# Patient Record
Sex: Female | Born: 1945
Health system: Southern US, Community
[De-identification: ages and names within clinical notes are randomized; demographics above are authoritative.]

## PROBLEM LIST (undated history)

## (undated) DIAGNOSIS — R002 Palpitations: Secondary | ICD-10-CM

## (undated) DIAGNOSIS — E785 Hyperlipidemia, unspecified: Secondary | ICD-10-CM

## (undated) DIAGNOSIS — E039 Hypothyroidism, unspecified: Secondary | ICD-10-CM

## (undated) DIAGNOSIS — M1990 Unspecified osteoarthritis, unspecified site: Secondary | ICD-10-CM

## (undated) DIAGNOSIS — R011 Cardiac murmur, unspecified: Secondary | ICD-10-CM

## (undated) DIAGNOSIS — I10 Essential (primary) hypertension: Secondary | ICD-10-CM

## (undated) DIAGNOSIS — F419 Anxiety disorder, unspecified: Secondary | ICD-10-CM

## (undated) DIAGNOSIS — T7840XA Allergy, unspecified, initial encounter: Secondary | ICD-10-CM

## (undated) DIAGNOSIS — Z8739 Personal history of other diseases of the musculoskeletal system and connective tissue: Secondary | ICD-10-CM

## (undated) DIAGNOSIS — H269 Unspecified cataract: Secondary | ICD-10-CM

## (undated) HISTORY — DX: Cardiac murmur, unspecified: R01.1

## (undated) HISTORY — DX: Allergy, unspecified, initial encounter: T78.40XA

## (undated) HISTORY — DX: Hyperlipidemia, unspecified: E78.5

## (undated) HISTORY — DX: Unspecified osteoarthritis, unspecified site: M19.90

## (undated) HISTORY — DX: Anxiety disorder, unspecified: F41.9

## (undated) HISTORY — PX: ANTERIOR FUSION CERVICAL SPINE: SUR626

## (undated) HISTORY — DX: Palpitations: R00.2

## (undated) HISTORY — PX: COLONOSCOPY: SHX174

## (undated) HISTORY — PX: BREAST EXCISIONAL BIOPSY: SUR124

## (undated) HISTORY — PX: ABDOMINAL HYSTERECTOMY: SHX81

## (undated) HISTORY — DX: Hypothyroidism, unspecified: E03.9

## (undated) HISTORY — PX: APPENDECTOMY: SHX54

## (undated) HISTORY — DX: Essential (primary) hypertension: I10

## (undated) HISTORY — PX: TONSILLECTOMY AND ADENOIDECTOMY: SHX28

## (undated) HISTORY — DX: Personal history of other diseases of the musculoskeletal system and connective tissue: Z87.39

## (undated) HISTORY — DX: Unspecified cataract: H26.9

## (undated) HISTORY — PX: CHOLECYSTECTOMY: SHX55

---

## 1998-11-23 ENCOUNTER — Ambulatory Visit (HOSPITAL_COMMUNITY): Admission: RE | Admit: 1998-11-23 | Discharge: 1998-11-23 | Payer: Self-pay | Admitting: *Deleted

## 1999-05-16 ENCOUNTER — Encounter: Payer: Self-pay | Admitting: *Deleted

## 1999-05-16 ENCOUNTER — Ambulatory Visit (HOSPITAL_COMMUNITY): Admission: RE | Admit: 1999-05-16 | Discharge: 1999-05-16 | Payer: Self-pay | Admitting: *Deleted

## 1999-11-26 ENCOUNTER — Encounter: Payer: Self-pay | Admitting: Endocrinology

## 1999-11-26 ENCOUNTER — Ambulatory Visit (HOSPITAL_COMMUNITY): Admission: RE | Admit: 1999-11-26 | Discharge: 1999-11-26 | Payer: Self-pay | Admitting: Endocrinology

## 2000-09-24 ENCOUNTER — Encounter: Admission: RE | Admit: 2000-09-24 | Discharge: 2000-09-24 | Payer: Self-pay | Admitting: *Deleted

## 2000-09-24 ENCOUNTER — Encounter: Payer: Self-pay | Admitting: *Deleted

## 2000-10-09 ENCOUNTER — Encounter (INDEPENDENT_AMBULATORY_CARE_PROVIDER_SITE_OTHER): Payer: Self-pay | Admitting: Specialist

## 2000-10-09 ENCOUNTER — Ambulatory Visit (HOSPITAL_BASED_OUTPATIENT_CLINIC_OR_DEPARTMENT_OTHER): Admission: RE | Admit: 2000-10-09 | Discharge: 2000-10-09 | Payer: Self-pay | Admitting: *Deleted

## 2001-09-28 ENCOUNTER — Encounter: Payer: Self-pay | Admitting: *Deleted

## 2001-09-28 ENCOUNTER — Encounter: Admission: RE | Admit: 2001-09-28 | Discharge: 2001-09-28 | Payer: Self-pay | Admitting: *Deleted

## 2001-10-04 ENCOUNTER — Encounter: Admission: RE | Admit: 2001-10-04 | Discharge: 2001-10-04 | Payer: Self-pay | Admitting: Orthopedic Surgery

## 2001-10-04 ENCOUNTER — Encounter: Payer: Self-pay | Admitting: Orthopedic Surgery

## 2002-03-07 ENCOUNTER — Ambulatory Visit (HOSPITAL_COMMUNITY): Admission: RE | Admit: 2002-03-07 | Discharge: 2002-03-07 | Payer: Self-pay | Admitting: Cardiovascular Disease

## 2002-03-07 ENCOUNTER — Encounter: Payer: Self-pay | Admitting: Cardiovascular Disease

## 2002-08-29 ENCOUNTER — Encounter: Payer: Self-pay | Admitting: Emergency Medicine

## 2002-08-29 ENCOUNTER — Emergency Department (HOSPITAL_COMMUNITY): Admission: EM | Admit: 2002-08-29 | Discharge: 2002-08-29 | Payer: Self-pay | Admitting: Emergency Medicine

## 2002-09-15 ENCOUNTER — Encounter: Admission: RE | Admit: 2002-09-15 | Discharge: 2002-09-15 | Payer: Self-pay | Admitting: *Deleted

## 2002-09-15 ENCOUNTER — Encounter: Payer: Self-pay | Admitting: *Deleted

## 2003-01-06 ENCOUNTER — Encounter: Admission: RE | Admit: 2003-01-06 | Discharge: 2003-01-06 | Payer: Self-pay | Admitting: *Deleted

## 2003-01-06 ENCOUNTER — Encounter: Payer: Self-pay | Admitting: *Deleted

## 2003-01-16 ENCOUNTER — Encounter: Payer: Self-pay | Admitting: *Deleted

## 2003-01-18 ENCOUNTER — Ambulatory Visit (HOSPITAL_COMMUNITY): Admission: RE | Admit: 2003-01-18 | Discharge: 2003-01-19 | Payer: Self-pay | Admitting: *Deleted

## 2003-01-18 ENCOUNTER — Encounter: Payer: Self-pay | Admitting: *Deleted

## 2003-01-18 ENCOUNTER — Encounter (INDEPENDENT_AMBULATORY_CARE_PROVIDER_SITE_OTHER): Payer: Self-pay | Admitting: Specialist

## 2003-04-10 ENCOUNTER — Encounter: Admission: RE | Admit: 2003-04-10 | Discharge: 2003-04-10 | Payer: Self-pay | Admitting: Endocrinology

## 2003-04-10 ENCOUNTER — Encounter: Payer: Self-pay | Admitting: Endocrinology

## 2003-09-05 ENCOUNTER — Encounter: Admission: RE | Admit: 2003-09-05 | Discharge: 2003-09-05 | Payer: Self-pay | Admitting: Surgery

## 2004-04-25 ENCOUNTER — Inpatient Hospital Stay (HOSPITAL_COMMUNITY): Admission: RE | Admit: 2004-04-25 | Discharge: 2004-04-26 | Payer: Self-pay | Admitting: Neurosurgery

## 2004-09-30 ENCOUNTER — Encounter: Admission: RE | Admit: 2004-09-30 | Discharge: 2004-09-30 | Payer: Self-pay | Admitting: Surgery

## 2005-04-02 ENCOUNTER — Encounter: Admission: RE | Admit: 2005-04-02 | Discharge: 2005-04-02 | Payer: Self-pay | Admitting: Surgery

## 2005-08-01 ENCOUNTER — Encounter: Admission: RE | Admit: 2005-08-01 | Discharge: 2005-08-01 | Payer: Self-pay | Admitting: Surgery

## 2005-10-06 ENCOUNTER — Encounter: Admission: RE | Admit: 2005-10-06 | Discharge: 2005-10-06 | Payer: Self-pay | Admitting: Surgery

## 2005-12-26 ENCOUNTER — Encounter: Admission: RE | Admit: 2005-12-26 | Discharge: 2005-12-26 | Payer: Self-pay | Admitting: Endocrinology

## 2005-12-31 ENCOUNTER — Encounter: Payer: Self-pay | Admitting: *Deleted

## 2006-01-12 ENCOUNTER — Encounter: Admission: RE | Admit: 2006-01-12 | Discharge: 2006-01-12 | Payer: Self-pay | Admitting: Endocrinology

## 2006-10-13 ENCOUNTER — Encounter: Admission: RE | Admit: 2006-10-13 | Discharge: 2006-10-13 | Payer: Self-pay | Admitting: Surgery

## 2006-10-27 ENCOUNTER — Encounter: Admission: RE | Admit: 2006-10-27 | Discharge: 2006-10-27 | Payer: Self-pay | Admitting: Surgery

## 2007-03-01 ENCOUNTER — Ambulatory Visit (HOSPITAL_COMMUNITY): Admission: RE | Admit: 2007-03-01 | Discharge: 2007-03-01 | Payer: Self-pay | Admitting: Endocrinology

## 2007-03-11 ENCOUNTER — Encounter: Admission: RE | Admit: 2007-03-11 | Discharge: 2007-03-11 | Payer: Self-pay | Admitting: Endocrinology

## 2007-10-22 ENCOUNTER — Encounter: Admission: RE | Admit: 2007-10-22 | Discharge: 2007-10-22 | Payer: Self-pay | Admitting: Surgery

## 2008-05-05 ENCOUNTER — Encounter: Admission: RE | Admit: 2008-05-05 | Discharge: 2008-05-05 | Payer: Self-pay | Admitting: Endocrinology

## 2008-06-30 ENCOUNTER — Observation Stay (HOSPITAL_COMMUNITY): Admission: EM | Admit: 2008-06-30 | Discharge: 2008-07-01 | Payer: Self-pay | Admitting: Emergency Medicine

## 2008-11-01 ENCOUNTER — Encounter: Admission: RE | Admit: 2008-11-01 | Discharge: 2008-11-01 | Payer: Self-pay | Admitting: Surgery

## 2009-11-13 ENCOUNTER — Encounter: Admission: RE | Admit: 2009-11-13 | Discharge: 2009-11-13 | Payer: Self-pay | Admitting: Surgery

## 2009-12-31 ENCOUNTER — Telehealth: Payer: Self-pay | Admitting: Internal Medicine

## 2010-01-11 ENCOUNTER — Encounter: Payer: Self-pay | Admitting: *Deleted

## 2010-01-24 ENCOUNTER — Encounter: Payer: Self-pay | Admitting: *Deleted

## 2010-01-29 ENCOUNTER — Ambulatory Visit: Payer: Self-pay | Admitting: Internal Medicine

## 2010-01-29 ENCOUNTER — Encounter (INDEPENDENT_AMBULATORY_CARE_PROVIDER_SITE_OTHER): Payer: Self-pay | Admitting: *Deleted

## 2010-09-22 ENCOUNTER — Encounter: Payer: Self-pay | Admitting: Surgery

## 2010-10-03 NOTE — Miscellaneous (Signed)
Summary: [facility]  Clinical Lists Changes  Medications: Added new medication of MIRALAX   POWD (POLYETHYLENE GLYCOL 3350) As per prep  instructions. - Signed Added new medication of REGLAN 10 MG  TABS (METOCLOPRAMIDE HCL) As per prep instructions. - Signed Added new medication of DULCOLAX 5 MG  TBEC (BISACODYL) Day before procedure take 2 at 3pm and 2 at 8pm. - Signed Rx of MIRALAX   POWD (POLYETHYLENE GLYCOL 3350) As per prep  instructions.;  #255gm x 0;  Signed;  Entered by: Ezra Sites RN;  Authorized by: Hart Carwin MD;  Method used: Electronically to [facility] [phone]*, [address] [place], [place], [place]  [address], Ph: [phone] or [phone], Fax: [phone] Rx of REGLAN 10 MG  TABS (METOCLOPRAMIDE HCL) As per prep instructions.;  #2 x 0;  Signed;  Entered by: Ezra Sites RN;  Authorized by: Hart Carwin MD;  Method used: Electronically to [facility] [phone]*, [address], [place], [place], [place]  [address], Ph: [phone] or [phone], Fax: [phone] Rx of DULCOLAX 5 MG  TBEC (BISACODYL) Day before procedure take 2 at 3pm and 2 at 8pm.;  #4 x 0;  Signed;  Entered by: Ezra Sites RN;  Authorized by: Hart Carwin MD;  Method used: Electronically to [facility] [phone]*, [address] [place], [place], [place]  [address], Ph: [phone] or [phone], Fax: [phone] Allergies: Added new allergy or adverse reaction of PENICILLIN Added new allergy or adverse reaction of SULFA Added new allergy or adverse reaction of STADOL Added new allergy or adverse reaction of DEMEROL Observations: Added new observation of NKA: F (01/29/2010 9:45)    Prescriptions: DULCOLAX 5 MG  TBEC (BISACODYL) Day before procedure take 2 at 3pm and 2 at 8pm.  #4 x 0   Entered by:   Ezra Sites RN   Authorized by:   Hart Carwin MD   Signed by:   Ezra Sites RN on 01/29/2010   Method used:   Electronically to        [facility] [phone]*  (retail)       [address] [place], [place]  [address]       Ph: [phone] or [phone]       Fax: [phone]   RxID:   [id number] REGLAN 10 MG  TABS (METOCLOPRAMIDE HCL) As per prep instructions.  #2 x 0   Entered by:   Ezra Sites RN   Authorized by:   Hart Carwin MD   Signed by:   Ezra Sites RN on 01/29/2010   Method used:   Electronically to        [facility] [phone]* (retail)       [address] [place], [place]  [address]       Ph: [phone] or [phone]       Fax: [phone]   RxID:   [id number] MIRALAX   POWD (POLYETHYLENE GLYCOL 3350) As per prep  instructions.  #255gm x 0   Entered by:   Ezra Sites RN   Authorized by:   Hart Carwin MD   Signed by:   Ezra Sites RN on 01/29/2010   Method used:   Electronically to        [facility] [phone]* (retail)       [address] [place], [place]  [address]       Ph: [phone] or [phone]       Fax: [phone]   RxID:   [id number]

## 2010-10-03 NOTE — Letter (Signed)
Summary: [facility] Instructions  [facility]  [address] [place], [place] [address]   Phone: [phone]  Fax: [phone]       Misty Newman    11-30-1945    MRN: [id number]       Procedure Day /Date:  Thursday 02/07/2010     Arrival Time:  9:00 am     Procedure Time: 10:00 am     Location of Procedure:                    _x _  [facility] (4th Floor)   PREPARATION FOR COLONOSCOPY WITH MIRALAX  Starting 5 days prior to your procedure Saturday 6/4 do not eat nuts, seeds, popcorn, corn, beans, peas,  salads, or any raw vegetables.  Do not take any fiber supplements (e.g. Metamucil, Citrucel, and Benefiber). ____________________________________________________________________________________________________   THE DAY BEFORE YOUR PROCEDURE         DATE: Wednesday 6/8  1   Drink clear liquids the entire day-NO SOLID FOOD  2   Do not drink anything colored red or purple.  Avoid juices with pulp.  No orange juice.  3   Drink at least 64 oz. (8 glasses) of fluid/clear liquids during the day to prevent dehydration and help the prep work efficiently.  CLEAR LIQUIDS INCLUDE: Water Jello Ice Popsicles Tea (sugar ok, no milk/cream) Powdered fruit flavored drinks Coffee (sugar ok, no milk/cream) Gatorade Juice: apple, white grape, white cranberry  Lemonade Clear bullion, consomm, broth Carbonated beverages (any kind) Strained chicken noodle soup Hard Candy  4   Mix the entire bottle of Miralax with 64 oz. of Gatorade/Powerade in the morning and put in the refrigerator to chill.  5   At 3:00 pm take 2 Dulcolax/Bisacodyl tablets.  6   At 4:30 pm take one Reglan/Metoclopramide tablet.  7  Starting at 5:00 pm drink one 8 oz glass of the Miralax mixture every 15-20 minutes until you have finished drinking the entire 64 oz.  You should finish drinking prep around 7:30 or 8:00 pm.  8   If you are nauseated, you may take the 2nd Reglan/Metoclopramide  tablet at 6:30 pm.        9    At 8:00 pm take 2 more DULCOLAX/Bisacodyl tablets.     THE DAY OF YOUR PROCEDURE      DATE:  Thursday 6/9  You may drink clear liquids until 8:00 am   (2 HOURS BEFORE PROCEDURE).   MEDICATION INSTRUCTIONS  Unless otherwise instructed, you should take regular prescription medications with a small sip of water as early as possible the morning of your procedure.   Additional medication instructions: Do not take Triamterne/HCTZ         OTHER INSTRUCTIONS  You will need a responsible adult at least 63 years of age to accompany you and drive you home.   This person must remain in the waiting room during your procedure.  Wear loose fitting clothing that is easily removed.  Leave jewelry and other valuables at home.  However, you may wish to bring a book to read or an iPod/MP3 player to listen to music as you wait for your procedure to start.  Remove all body piercing jewelry and leave at home.  Total time from sign-in until discharge is approximately 2-3 hours.  You should go home directly after your procedure and rest.  You can resume normal activities the day after your procedure.  The day of your  procedure you should not:   Drive   Make legal decisions   Operate machinery   Drink alcohol   Return to work  You will receive specific instructions about eating, activities and medications before you leave.   The above instructions have been reviewed and explained to me by   Ezra Sites RN  Jan 29, 2010 10:22 AM     I fully understand and can verbalize these instructions _____________________________ Date _______

## 2010-11-07 ENCOUNTER — Encounter (INDEPENDENT_AMBULATORY_CARE_PROVIDER_SITE_OTHER): Payer: Self-pay | Admitting: *Deleted

## 2010-11-07 ENCOUNTER — Other Ambulatory Visit: Payer: Self-pay | Admitting: Surgery

## 2010-11-07 DIAGNOSIS — Z1231 Encounter for screening mammogram for malignant neoplasm of breast: Secondary | ICD-10-CM

## 2010-11-19 ENCOUNTER — Ambulatory Visit
Admission: RE | Admit: 2010-11-19 | Discharge: 2010-11-19 | Disposition: A | Discharge status: Home / Self Care | Payer: PRIVATE HEALTH INSURANCE | Source: Ambulatory Visit | Attending: Surgery | Admitting: Surgery

## 2010-11-19 DIAGNOSIS — Z1231 Encounter for screening mammogram for malignant neoplasm of breast: Secondary | ICD-10-CM

## 2010-11-27 ENCOUNTER — Ambulatory Visit (AMBULATORY_SURGERY_CENTER): Payer: PRIVATE HEALTH INSURANCE | Admitting: *Deleted

## 2010-11-27 VITALS — Ht 67.0 in | Wt 158.0 lb

## 2010-11-27 DIAGNOSIS — Z800 Family history of malignant neoplasm of digestive organs: Secondary | ICD-10-CM

## 2010-11-27 MED ORDER — PEG-KCL-NACL-NASULF-NA ASC-C 100 G PO SOLR: 1.0000 | Freq: Once | ORAL | Status: AC

## 2010-12-09 ENCOUNTER — Telehealth: Payer: Self-pay | Admitting: Internal Medicine

## 2010-12-09 NOTE — Telephone Encounter (Signed)
OK to stay on ASA and Voltaren

## 2010-12-11 ENCOUNTER — Encounter: Payer: Self-pay | Admitting: Internal Medicine

## 2010-12-11 ENCOUNTER — Ambulatory Visit (AMBULATORY_SURGERY_CENTER): Payer: PRIVATE HEALTH INSURANCE | Admitting: Internal Medicine

## 2010-12-11 DIAGNOSIS — Z800 Family history of malignant neoplasm of digestive organs: Secondary | ICD-10-CM

## 2010-12-11 DIAGNOSIS — D126 Benign neoplasm of colon, unspecified: Secondary | ICD-10-CM

## 2010-12-11 DIAGNOSIS — R933 Abnormal findings on diagnostic imaging of other parts of digestive tract: Secondary | ICD-10-CM

## 2010-12-11 DIAGNOSIS — Z1211 Encounter for screening for malignant neoplasm of colon: Secondary | ICD-10-CM

## 2010-12-11 MED ORDER — SODIUM CHLORIDE 0.9 % IV SOLN: 500.0000 mL | INTRAVENOUS | Status: DC

## 2010-12-11 NOTE — Patient Instructions (Addendum)
INFORMATION ON POLYPS , HIGH FIBER DIET , 5 YR RECALL DATE AND DIET RECOMMENDED FOR TODAY REVIEWED WITH PT AND CAREPARTNER.

## 2010-12-12 ENCOUNTER — Telehealth: Payer: Self-pay

## 2010-12-12 NOTE — Telephone Encounter (Signed)

## 2010-12-13 ENCOUNTER — Encounter: Payer: Self-pay | Admitting: Internal Medicine

## 2010-12-13 NOTE — Telephone Encounter (Signed)
Error

## 2010-12-16 ENCOUNTER — Encounter: Payer: Self-pay | Admitting: Internal Medicine

## 2011-01-14 NOTE — H&P (Signed)
Misty Newman, Misty Newman                 ACCOUNT NO.:  [id number]   MEDICAL RECORD NO.:  [id number]          PATIENT TYPE:  EMS   LOCATION:  ED                           FACILITY:  [facility]   PHYSICIAN:  Eduard Clos, MDDATE OF BIRTH:  28-Mar-1946   DATE OF ADMISSION:  06/30/2008  DATE OF DISCHARGE:                              HISTORY & PHYSICAL   PRIMARY CARE PHYSICIAN:  Brooke Bonito, M.D.   CARDIOLOGIST:  Richard A. Alanda Amass, M.D.   CHIEF COMPLAINT:  Palpitations.   HISTORY OF PRESENT ILLNESS:  A 65 year old female with a history of  hypertension, hyperlipidemia, hypothyroidism, who presented to the ER  complaining of palpitations off and on over the last 24 hours.  Patient  has been having these symptoms wherein she will get a sudden onset of  her heart beating fast.  Denies any associated dizziness, loss of  consciousness, nausea or vomiting, fever or chills.  Denies any chest  pain.  The palpitations resolve spontaneously.  She has noticed that her  mother has had a new roommate in the nursing home, in which case is a  problem for her mother.  This has prompted her to get very anxious.  She  states that the symptoms started then.  She did go to her cardiologist,  Dr. Alanda Amass, yesterday, wherein she was found to have high blood  pressure and her Altace was increased to 10 mg from 5.  Patient also  stated that she has not been taking her Lopressor as advised, she was  only taking half a pill.  She was supposed to take a whole pill of 100  mg twice daily.  Patient in the ER was found to have a heart rate of 110  and is being admitted for further observation and management along with  control of blood pressure.   PAST MEDICAL HISTORY:  1. Hypertension.  2. Hyperlipidemia.  3. Hyperthyroidism.  4. Chronic back pain.   PAST SURGICAL HISTORY:  1. Hysterectomy.  2. Appendectomy.  3. Neck surgery for disk prolapse.   MEDICATIONS PRIOR TO ADMISSION:  1. Metoprolol 100 mg p.o.  b.i.d.  2. Lanoxin 125 mcg p.o. daily.  3. Synthroid 75 mcg p.o. daily.  4. Altace 10 mg p.o. daily.  5. Zocor 20 mg p.o. daily.  6. Triamterene/HCTZ 37.5/25 mg p.o. daily p.r.n.  7. Flexeril 10 mg p.o. daily p.r.n. low back pain.  8. Voltaren 75 mg p.o. daily p.r.n. low back pain.   ALLERGIES:  PENICILLIN, SULFA.   FAMILY HISTORY:  Nothing contributory.   SOCIAL HISTORY:  Patient lives with her husband.  Denies smoking  cigarettes, drinking alcohol, or using illegal drugs.   REVIEW OF SYSTEMS:  As per history of present illness, nothing else  significant.   PHYSICAL EXAMINATION:  Patient currently no acute distress.  VITAL SIGNS:  Blood pressure 180/115, pulse 110 per minute, temperature  98, respirations 18 per minute, O2 sat 98%.  HEENT:  Anicteric.  No pallor.  CHEST:  Bilateral air entry present.  No rhonchi.  No crepitation.  HEART:  S1 and S2  heard.  ABDOMEN:  Soft, nontender.  Bowel sounds heard.  No guarding, no  rigidity.  CNS:  Awake and alert to time, place, and person.  Moves upper and lower  extremities 5/5.  EXTREMITIES:  Peripheral pulses are felt bilaterally.   LABS:  EKG shows a normal sinus rhythm .  Rate of around 90 per minute.   Chest x-ray:  Nothing acute.   CBC:  WBC is 14.4, hemoglobin 14.4, hematocrit 42.9, platelets 295.  PT/INR 12.6 and 0.9.  Complete metabolic panel:  Sodium 139, potassium  4.1, chloride 106, carbon dioxide 23, glucose 155, BUN 16, creatinine  0.8, total bilirubin 0.7, AST 32, ALT 33, albumin 4, calcium 9.6,  magnesium 2.1, CK-MB 1.2.  Troponin I less than 0.05.  BNP less than 30.  Digoxin 0.3.  Urine negative for nitrite or leukocytes.   ASSESSMENT:  1. Palpitations, rule out any arrhythmia.  2. Uncontrolled hypertension.  3. History of hyperlipidemia.  4. Possibility of anxiety disorder.  5. Chronic back pain.   PLAN:  To admit patient to telemetry for observation to see if there are  any arrhythmias. Control her blood  pressure.  Resume home medication.  Will place on p.r.n. Xanax.  Further recommendations as condition  warrants.      Eduard Clos, MD  Electronically Signed     ANK/MEDQ  D:  06/30/2008  T:  06/30/2008  Job:  [id number]

## 2011-01-14 NOTE — Discharge Summary (Signed)
NAMEANYE, BROSE                 ACCOUNT NO.:  [id number]   MEDICAL RECORD NO.:  [id number]          PATIENT TYPE:  INP   LOCATION:  1421                         FACILITY:  [facility]   PHYSICIAN:  Isidor Holts, M.D.  DATE OF BIRTH:  1946/08/21   DATE OF ADMISSION:  06/30/2008  DATE OF DISCHARGE:  07/01/2008                               DISCHARGE SUMMARY   PRIMARY CARE PHYSICIAN:  Brooke Bonito, M.D.   PRIMARY CARDIOLOGIST:  Richard A. Alanda Amass, M.D.   DISCHARGE DIAGNOSES:  1. Palpitations.  2. Uncontrolled hypertension.  3. Dyslipidemia.  4. Dysthyroidism.  5. History of chronic back pain.   DISCHARGE MEDICATIONS:  1. Metoprolol 100 mg p.o. b.i.d.  2. Lanoxin 125 mcg p.o. daily.  3. Synthroid 100 mcg p.o. daily (was on 75 mcg p.o. daily).  4. Altace 10 mg p.o. b.i.d. (was on 10 mg p.o. daily).  5. Zocor 20 mg p.o. q.h.s.  6. Lovaza 2 tabs p.o. daily.  7. Flexeril 10 mg p.o. daily p.r.n.  8. Voltaren 75 mg p.o. p.r.n. daily.   Note: Triamterene/Hydrochlorothiazide has ben discontinued, until  reviewed by primary MD.   PROCEDURES:  Chest x-ray dated June 30, 2008, this showed no acute  cardiopulmonary disease.   CONSULTATIONS:  None.   ADMISSION HISTORY:  As in H and P notes of June 30, 2008, dictated by  Dr. Eduard Clos.  However, in brief, this is a 65 year old  female, with known history of hypertension, dyslipidemia, dysthyroidism,  chronic back pain, status post hysterectomy, status post appendectomy,  status post neck surgery for disk prolapse, presenting with palpitations  on and off for 24 hours prior.  Denied associated chest pain, shortness  of breath, dizziness or alteration of consciousness.  Reportedly, she  had been seen by her cardiologist, Dr. Pearletha Furl. Alanda Amass, on June 25, 2008, and her was found to have uncontrolled hypertension.  Her  Altace was therefore increased from 5 mg p.o. daily to 10 mg p.o. daily,  although she had not  filled the prescription at the time of presentation  to the emergency department.  In the emergency department, she was found  to have a tachycardia, with heart rate of 110, blood pressure was found  to be markedly elevated at 180/115 mmHg.  The patient was therefore  admitted for further evaluation, investigation and management.   HOSPITAL COURSE:  1. Uncontrolled hypertension.  For details of presentation, refer to      admission history above.  The patient presented with a blood      pressure of 180/115 mmHg.  She was placed on pre-admission      Metoprolol at a dose of 100 mg p.o. b.i.d. and commenced on      Ramipril at a dose of 10 mg p.o. daily.  By July 01, 2008, blood      pressure had improved to 153/70 mmHg.  Ramipril dosage has been      increased further to 10 mg p.o. b.i.d. and it is anticipated that      this will be adequate for blood  pressure control, although further      adjustments may be made by the patient's primary MD or      cardiologist, as indicated.   1. Dysthyroidism.  The patient is a known case of hypothyroidism, on      replacement therapy.  TSH during this hospitalization was mildly      elevated at 6.225.  Synthroid dosage has therefore been increased      to 100 mcg daily.   1. Palpitations.  This is likely secondary to #1 and #2 above.  The      patient remained asymptomatic throughout her hospitalization and      remained in sinus rhythm.  Cardiac enzymes were cycled and remained      unelevated.  A 12-lead EKG showed no acute ischemic changes.      Clearly acute coronary syndrome has been ruled out.   1. Dyslipidemia.  The patient remained on pre-admission doses of      statin, as well as omega-3 fatty acids.   1. History of chronic back pain.  This was not problematic during the      course of this hospitalization.   DISPOSITION:  The patient was on July 01, 2008, considered clinically  stable for discharge.  She was asymptomatic, and  was therefore  discharged accordingly.   DIET:  Heart healthy.   ACTIVITY:  As tolerated.   FOLLOWUP INSTRUCTIONS:  1. The patient is to follow up with her primary cardiologist, Dr.      Pearletha Furl. Alanda Amass, in the next 1-2 weeks.  She has been      instructed to call for an appointment.  2. In addition, she has been instructed to follow up with her primary      MD, Dr. Brooke Bonito, per prior scheduled appointment.      Isidor Holts, M.D.  Electronically Signed     CO/MEDQ  D:  07/01/2008  T:  07/01/2008  Job:  [id number]   cc:   Brooke Bonito, M.D.  Fax: [phone]   Richard A. Alanda Amass, M.D.  Fax: [phone]

## 2011-01-17 NOTE — Op Note (Signed)
NAME:  Misty Newman, Misty Newman                           ACCOUNT NO.:  [id number]   MEDICAL RECORD NO.:  [id number]                   PATIENT TYPE:  OIB   LOCATION:  NA                                   FACILITY:  [facility]   PHYSICIAN:  Maisie Fus B. Samuella Cota, M.D.               DATE OF BIRTH:  1946/06/07   DATE OF PROCEDURE:  01/18/2003  DATE OF DISCHARGE:                                 OPERATIVE REPORT   CCS# [id number]   PREOPERATIVE DIAGNOSIS:  1. Chronic cholecystitis with cholelithiasis.  2. Capillary hemangioma, anterior abdominal wall.   POSTOPERATIVE DIAGNOSIS:  1. Chronic cholecystitis with cholelithiasis.  2. Capillary hemangioma, anterior abdominal wall.   OPERATION PERFORMED:  1. Laparoscopic cholecystectomy with operative cholangiogram.  2. Excision of capillary hemangioma, anterior abdominal wall.   SURGEON:  Maisie Fus B. Samuella Cota, M.D.   ASSISTANT:  Gita Kudo, M.D.   ANESTHESIA:  General.  Anesthesiologist and CRNA.   DESCRIPTION OF PROCEDURE:  The patient was taken to the operating room and  placed on the table in supine position and after satisfactory general  anesthetic with intubation, the entire abdomen was prepped and draped as a  sterile field.  0.25% Marcaine without epinephrine was injected at the  infraumbilical area and then a vertical infraumbilical incision was made  through skin and subcutaneous tissue and midline fascia.  The peritoneal  cavity was entered.  A finger was placed into the peritoneal cavity and  there were no adhesions going towards the gallbladder.  A pursestring suture  of 0 Vicryl was placed and the Hasson trocar placed into the abdomen and the  abdomen insufflated to 14 mmHg pressure.  A second 10 mm trocar was placed  just to the right of midline.  Two 5 mm trocars were placed laterally.  The  gallbladder was placed on traction.  There were adhesions to the gallbladder  which was taken down with gentle dissection.  The cystic duct and cystic  artery were identified.  The cystic duct was dissected free and an Endoclip  placed on the gallbladder side of the cystic duct.  A small opening was made  in the cystic duct.  A Cook cholangiocath was introduced through a separate  stab wound in the right upper quadrant of the abdomen.  The catheter was  placed into the duct and held with a Hemoclip.  Using real time C-arm  fluoroscopy, cholangiogram was carried out which revealed good filling of  the entire biliary system with spillage into the duodenum.  There were no  filling defects.  Following the cholangiogram which showed a fairly long  cystic duct, the cystic duct was triply clipped and then divided.  The  cystic artery was doubly clipped on the remaining side, once on the  gallbladder side and divided.  The gallbladder was dissected from the bed  and there were two further arteries which were doubly  clipped and divided.  The gallbladder was removed from the bed using the cautery.  Bleeding was  controlled.  The gallbladder was then brought to the anterior abdominal wall  at the infraumbilical incision and because of large stones, the gallbladder  was opened.  Some stones were crushed and removed and the gallbladder  delivered.  The pursestring suture at the infraumbilical incision was tied  and there seemed to be good closure at this point.  The right upper quadrant  was irrigated.  There seemed to be no evidence of bleeding or bile leak.  The two lateral trocars were removed under direct vision and abdomen  deflated.  The second 10 mm trocar was removed.  The patient had a small  capillary hemangioma of the anterior abdominal wall between the two midline  trocars.  This was excised with a #11 blade and the base was not very large,  so the base was just cauterized to control the bleeding.  All trocar sites  were then injected with 0.25% Marcaine without epinephrine.  The two midline  incisions were closed with running subcutaneous  sutures of 4-0 Monocryl and  the two lateral sites were closed with single simple inverted subcu 4-0  Monocryl.  Benzoin and half inch Steri-Strips were applied to all trocar  sites and also to the area of cholangiocath puncture site.  Dry sterile  dressings were applied.  The patient seemed to tolerate the procedure well  and was taken to the PACU in satisfactory condition.                                                Thomas B. Samuella Cota, M.D.    TBP/MEDQ  D:  01/18/2003  T:  01/18/2003  Job:  [id number]   cc:   Brooke Bonito, M.D.  [address] [place] [address]  [place]  [place] [address]  Fax: [phone]   Richard A. Alanda Amass, M.D.  [phone] [address]  [place]  [place] [address]  Fax: [phone]

## 2011-01-17 NOTE — Op Note (Signed)
NAME:  Misty Newman, Misty Newman                           ACCOUNT NO.:  [id number]   MEDICAL RECORD NO.:  [id number]                   PATIENT TYPE:  INP   LOCATION:  2899                                 FACILITY:  [facility]   PHYSICIAN:  Hewitt Shorts, M.D.            DATE OF BIRTH:  Apr 01, 1946   DATE OF PROCEDURE:  04/25/2004  DATE OF DISCHARGE:                                 OPERATIVE REPORT   PREOPERATIVE DIAGNOSIS:  Cervical disk herniation, cervical spondylosis,  cervical degenerative disk disease, cervical stenosis and cervical  radiculopathy.   POSTOPERATIVE DIAGNOSIS:  Cervical disk herniation, cervical spondylosis,  cervical degenerative disk disease, cervical stenosis and cervical  radiculopathy.   OPERATION PERFORMED:  C4-5, C5-6 and C6-7 anterior cervical diskectomy and  arthrodesis with iliac crest allograft and tether cervical plating.   SURGEON:  Hewitt Shorts, M.D.   ASSISTANT:  Clydene Fake, M.D.   ANESTHESIA:  General endotracheal.   INDICATIONS FOR PROCEDURE:  The patient is a 65 year old woman who presented  with acute left C5 cervical radiculopathy with profound weakness of the left  deltoid and moderate weakness of the left biceps.  She was found to have a  left C4-5 cervical disk herniation superimposed upon a significant  spondylosis and degenerative disk disease.  There was advanced spondylosis  and degenerative disk disease at both C5-6 and C6-7 with significant canal  stenosis.  Decision was made to proceed with three level anterior cervical  diskectomy and arthrodesis.   DESCRIPTION OF PROCEDURE:  The patient was brought to the operating room and  placed under general endotracheal anesthesia.  The patient was placed in 10  pounds of halter traction.  The neck was prepped with Betadine soap and  solution and draped in sterile fashion.  An oblique incision was made on the  left side of the neck paralleling the anterior border of the left  sternocleidomastoid.  The line of the incision was infiltrated with local  anesthetic and epinephrine.  Dissection was carried down through the  subcutaneous tissue and platysma with bipolar cautery used to maintain  hemostasis. Then dissection was carried out to an avascular plane, leaving  the sternocleidomastoid, carotid artery and jugular vein laterally and  trachea and esophagus medially.  The ventral aspect of the vertebral column  identified and a localizing x-ray was taken.  The C4-5, C5-6, and C6-7  intervertebral disk spaces were identified.  An x-ray was taken to confirm  the localization.  The operating microscope was draped and brought into the  field to provide additional magnification, illumination and visualization  and the decompression was performed using microdissection and microsurgical  technique. Anterior osteophytic overgrowth was removed using a double action  rongeur and osteophyte removal tool and the X-Max drill.  Diskectomy was  performed using a variety of microcurets and pituitary rongeurs and the  cartilaginous end plates of the corresponding vertebrae were removed using  microcurets along with the X-Max drill.  There was significant posterior  osteophytic overgrowth at each level.  This was removed using the X-Max  drill along with 2 mm Kerrison punch with a thin foot plate.  On there left  side at C4-5, we did find a soft disk herniation and this was removed and at  each level, we were able to decompress the spinal canal, thecal sac and  nerve roots within the neural foramina bilaterally.  Once the decompression  was completed, hemostasis was established with the use of Gelfoam soaked in  Thrombin .  We then used bone sizers to size the intervertebral disk space  at each level and selected two 6 mm grafts and one 7 mm graft.  They were  all hydrated in saline solution and positioned in the intervertebral disk  space and countersunk.  We selected 6 mm grafts  for the C4-5 and C5-6 level  and a 7 mm graft at C6-7.  Each graft was positioned and countersunk.  Once  all three grafts were positioned, the traction was discontinued.  We  selected a 52 mm tether cervical plate and it was positioned over the fusion  construct and secured to each of the vertebrae with 4.0 x 14 mm screws.  We  placed two screws each at the C4 and C7 levels and a single screw each at  the C5 and C6 levels.  X-ray was taken and we did adjust the screws at C4.  Another x-ray was taken.  We were pleased with the position of the bone  grafts and plate and screws.  The wound was irrigated extensively with  bacitracin solution, checked for hemostasis which was established and  confirmed and then we proceeded with closure.  The platysma was closed with  interrupted inverted 2-0 undyed Vicryl sutures.  Subcutaneous and  subcuticular layer were closed with interrupted inverted 2-0 and 3-0 undyed  Vicryl sutures and the skin edges were approximated with DermaBond.  The  patient tolerated the procedure well.  The estimated blood loss was 75 mL.  Sponge, needle and instrument counts were correct.  Following surgery, the  patient was to be reversed from his anesthetic, extubated and transferred to  the recovery room for further care.                                               Hewitt Shorts, M.D.    RWN/MEDQ  D:  04/25/2004  T:  04/26/2004  Job:  [id number]

## 2011-01-17 NOTE — Op Note (Signed)
[place]  Patient:    Misty Newman, Misty Newman                        MRN: [id number] Proc. Date: 10/09/00 Adm. Date:  [id number] Disc. Date: [id number] Attending:  Kandis Mannan CC:         Bernadene Person, M.D.   Operative Report  CCS NUMBER:  [id number]  PREOPERATIVE DIAGNOSIS:  Bloody nipple discharge, right breast.  POSTOPERATIVE DIAGNOSIS:  Bloody nipple discharge, right breast.  OPERATION:  Excision of ductal tissue, right breast.  SURGEON:  Maisie Fus B. Samuella Cota, M.D.  ANESTHESIA:  1% Xylocaine local with anesthesia monitoring.  ANESTHESIOLOGIST:  CRNA.  DESCRIPTION OF PROCEDURE:  The patient was taken to the operating room and placed on the table in a supine position.  The right breast was prepped and draped as a sterile field.  A circumareolar incision was outlined from about 7 oclock to 11 oclock.  The patient had had a history of bloody nipple discharge, but pressure over the 9 oclock position today did not produce any drainage.  Xylocaine 1% local was used to infiltrate the skin and underlying breast tissue.  A circumareolar incision was then made, as outlined.  Just beneath the skin, a large, dilated duct at the 9 oclock position was encountered.  The ductal tissue beneath the nipple and areolar complex was divided back to the nipple.  The enlarged duct did not show any evidence of blood, but there appeared to be some fleshy material inside the duct which was placed in the specimen container.  Dissection, mainly using the cautery, was used to go around this duct laterally.  It did not seem to go very far medially, and deep.  It was hard to know whether the dilatation I was seeing was probably cystic changes with some small cysts or whether this was actually a duct going that deep posteriorly.  I probably went 2 cm deep to the nipple and areolar complex.  The tissue was excised, and a suture was then placed at the open duct which was just  retronipple.  A hemostat had been placed into the duct, and it did not seem to pass very far, and it was thought that maybe the fleshy material which came out of the duct was the papilloma.  The other changes probably represent fibrocystic changes.  Bleeding was controlled with the cautery.  The wound was irrigated.  Subcutaneous tissue closed with interrupted sutures of 3-0 Vicryl, and the skin was closed with a running subcuticular suture of 4-0 Vicryl.  Benzoin and Steri-Strips were used to reinforce the circumareolar incision.  A pressure dressing using 4 x 4s, ABD, and 4 inch Hypafix was applied.  The patient seemed to tolerate the procedure well and was taken to the PACU in satisfactory condition. DD:  10/09/00 TD:  10/11/00 Job: [id number]

## 2011-02-24 NOTE — Letter (Signed)
Summary: Previsit letter  [facility]  [address] [place], [place] [address]   Phone: [phone]  Fax: [phone]       01/11/2010 MRN: [id number]  Misty Newman [address] [place], [place]  [address]  [place]  Dear Ms. [name],  Welcome to the Gastroenterology Division at [facility].    You are scheduled to see a nurse for your pre-procedure visit on 01/29/2010 at 10:00am on the 3rd floor at [facility], [address].  We ask that you try to arrive at our office 15 minutes prior to your appointment time to allow for check-in.  Your nurse visit will consist of discussing your medical and surgical history, your immediate family medical history, and your medications.    Please bring a complete list of all your medications or, if you prefer, bring the medication bottles and we will list them.  We will need to be aware of both prescribed and over the counter drugs.  We will need to know exact dosage information as well.  If you are on blood thinners (Coumadin, Plavix, Aggrenox, Ticlid, etc.) please call our office today/prior to your appointment, as we need to consult with your physician about holding your medication.   Please be prepared to read and sign documents such as consent forms, a financial agreement, and acknowledgement forms.  If necessary, and with your consent, a friend or relative is welcome to sit-in on the nurse visit with you.  Please bring your insurance card so that we may make a copy of it.  If your insurance requires a referral to see a specialist, please bring your referral form from your primary care physician.  No co-pay is required for this nurse visit.     If you cannot keep your appointment, please call [phone] to cancel or reschedule prior to your appointment date.  This allows [place] the opportunity to schedule an appointment for another patient in need of care.    Thank you for choosing [facility] for your medical needs.   We appreciate the opportunity to care for you.  Please visit [place] at our website  to learn more about our practice.                     Sincerely.                                                                                                                   The Gastroenterology Division

## 2011-02-24 NOTE — Letter (Signed)
Summary: Pre Visit Letter Revised  [facility]  [address] [place], [place] [address]   Phone: [phone]  Fax: [phone]        11/07/2010 MRN: [id number] Misty Newman [address] [place], [place]  [address]  [place]             Procedure Date:  12-11-10           Recall Colon---Dr. Juanda Chance   Welcome to the Gastroenterology Division at [facility].    You are scheduled to see a nurse for your pre-procedure visit on 11-27-10 at 9:00a.m. on the 3rd floor at [facility], [address].  We ask that you try to arrive at our office 15 minutes prior to your appointment time to allow for check-in.  Please take a minute to review the attached form.  If you answer "Yes" to one or more of the questions on the first page, we ask that you call the person listed at your earliest opportunity.  If you answer "No" to all of the questions, please complete the rest of the form and bring it to your appointment.    Your nurse visit will consist of discussing your medical and surgical history, your immediate family medical history, and your medications.   If you are unable to list all of your medications on the form, please bring the medication bottles to your appointment and we will list them.  We will need to be aware of both prescribed and over the counter drugs.  We will need to know exact dosage information as well.    Please be prepared to read and sign documents such as consent forms, a financial agreement, and acknowledgement forms.  If necessary, and with your consent, a friend or relative is welcome to sit-in on the nurse visit with you.  Please bring your insurance card so that we may make a copy of it.  If your insurance requires a referral to see a specialist, please bring your referral form from your primary care physician.  No co-pay is required for this nurse visit.     If you cannot keep your appointment, please call [phone] to cancel or reschedule prior to  your appointment date.  This allows [place] the opportunity to schedule an appointment for another patient in need of care.    Thank you for choosing [facility] for your medical needs.  We appreciate the opportunity to care for you.  Please visit [place] at our website  to learn more about our practice.  Sincerely, The Gastroenterology Division

## 2011-02-24 NOTE — Telephone Encounter (Signed)
Summary: Schedule colonoscopy  Phone Note Outgoing Call   Call placed by: Harlow Mares CMA [facility]),  Dec 31, 2009 11:48 AM Call placed to: Patient Summary of Call: Left message on patients machine to call back. patient is due for a colonoscopy due to positive family history of polyps.  Initial call taken by: Harlow Mares CMA [facility]),  Dec 31, 2009 11:49 AM  Follow-up for Phone Call        scheduled on 02/07/2010 Follow-up by: Harlow Mares CMA [facility]),  Jan 11, 2010 2:51 PM

## 2011-02-24 NOTE — Letter (Signed)
Summary: Miralax Instructions  [facility]  [address]   [place], [place] [address]   Phone: [phone]  Fax: [phone]       Misty Newman    October 14, 1945    MRN: [id number]       Procedure Day /Date: Thursday, 02-07-10     Arrival Time: 9:00 a.m.     Procedure Time: 10:00 a.m.     Location of Procedure:                    x   [facility] (4th Floor)   PREPARATION FOR COLONOSCOPY WITH MIRALAX  Starting 5 days prior to your procedure 02-02-10 do not eat nuts, seeds, popcorn, corn, beans, peas,  salads, or any raw vegetables.  Do not take any fiber supplements (e.g. Metamucil, Citrucel, and Benefiber). ____________________________________________________________________________________________________   THE DAY BEFORE YOUR PROCEDURE         DATE: 02-06-10  DAY: Wednesday  1   Drink clear liquids the entire day-NO SOLID FOOD  2   Do not drink anything colored red or purple.  Avoid juices with pulp.  No orange juice.  3   Drink at least 64 oz. (8 glasses) of fluid/clear liquids during the day to prevent dehydration and help the prep work efficiently.  CLEAR LIQUIDS INCLUDE: Water Jello Ice Popsicles Tea (sugar ok, no milk/cream) Powdered fruit flavored drinks Coffee (sugar ok, no milk/cream) Gatorade Juice: apple, white grape, white cranberry  Lemonade Clear bullion, consomm, broth Carbonated beverages (any kind) Strained chicken noodle soup Hard Candy  4   Mix the entire bottle of Miralax with 64 oz. of Gatorade/Powerade in the morning and put in the refrigerator to chill.  5   At 3:00 pm take 2 Dulcolax/Bisacodyl tablets.  6   At 4:30 pm take one Reglan/Metoclopramide tablet.  7  Starting at 5:00 pm drink one 8 oz glass of the Miralax mixture every 15-20 minutes until you have finished drinking the entire 64 oz.  You should finish drinking prep around 7:30 or 8:00 pm.  8   If you are nauseated, you may take the 2nd Reglan/Metoclopramide  tablet at 6:30 pm.        9    At 8:00 pm take 2 more DULCOLAX/Bisacodyl tablets.     THE DAY OF YOUR PROCEDURE      DATE:  02-07-10  DAY: Thursday  You may drink clear liquids until 8:00 a.m.  (2 HOURS BEFORE PROCEDURE).   MEDICATION INSTRUCTIONS  Unless otherwise instructed, you should take regular prescription medications with a small sip of water as early as possible the morning of your procedure.    Additional medication instructions:  Do not take Triamterene/HCTZ day of procedure.         OTHER INSTRUCTIONS  You will need a responsible adult at least 63 years of age to accompany you and drive you home.   This person must remain in the waiting room during your procedure.  Wear loose fitting clothing that is easily removed.  Leave jewelry and other valuables at home.  However, you may wish to bring a book to read or an iPod/MP3 player to listen to music as you wait for your procedure to start.  Remove all body piercing jewelry and leave at home.  Total time from sign-in until discharge is approximately 2-3 hours.  You should go home directly after your procedure and rest.  You can resume normal activities the day after your procedure.  The day of your procedure you should not:   Drive   Make legal decisions   Operate machinery   Drink alcohol   Return to work  You will receive specific instructions about eating, activities and medications before you leave.   The above instructions have been reviewed and explained to me by    Ezra Sites RN  Jan 29, 2010 4:01 PM     I fully understand and can verbalize these instructions _____________________________ Date _______

## 2011-06-02 LAB — CARDIAC PANEL(CRET KIN+CKTOT+MB+TROPI)
CK, MB: 1.6
CK, MB: 2.2
CK, MB: 2.4
Relative Index: INVALID
Relative Index: INVALID
Relative Index: INVALID
Total CK: 25
Total CK: 41
Total CK: 42
Troponin I: 0.01
Troponin I: 0.02
Troponin I: 0.02

## 2011-06-02 LAB — BASIC METABOLIC PANEL
BUN: 17
CO2: 25
Calcium: 9
Chloride: 103
Creatinine, Ser: 0.82
GFR calc Af Amer: >60
GFR calc non Af Amer: >60
Glucose, Bld: 103 — ABNORMAL HIGH
Potassium: 3.6
Sodium: 140

## 2011-06-02 LAB — POCT CARDIAC MARKERS
CKMB, poc: 1.2
Myoglobin, poc: 67.4
Troponin i, poc: <0.05

## 2011-06-02 LAB — CBC
HCT: 38.1
HCT: 42.9
Hemoglobin: 12.7
Hemoglobin: 14.4
MCHC: 33.4
MCHC: 33.5
MCV: 90
MCV: 90.2
Platelets: 247
Platelets: 295
RBC: 4.23
RBC: 4.75
RDW: 13.2
RDW: 13.3
WBC: 14.4 — ABNORMAL HIGH
WBC: 8.9

## 2011-06-02 LAB — DIFFERENTIAL
Basophils Absolute: 0
Basophils Relative: 0
Eosinophils Absolute: 0
Eosinophils Relative: 0
Lymphocytes Relative: 14
Lymphs Abs: 2
Monocytes Absolute: 0.5
Monocytes Relative: 4
Neutro Abs: 11.8 — ABNORMAL HIGH
Neutrophils Relative %: 82 — ABNORMAL HIGH

## 2011-06-02 LAB — URINALYSIS, ROUTINE W REFLEX MICROSCOPIC
Bilirubin Urine: NEGATIVE
Glucose, UA: NEGATIVE
Hgb urine dipstick: NEGATIVE
Ketones, ur: NEGATIVE
Nitrite: NEGATIVE
Protein, ur: NEGATIVE
Specific Gravity, Urine: 1.01
Urobilinogen, UA: 0.2
pH: 6.5

## 2011-06-02 LAB — PROTIME-INR
INR: 0.9
Prothrombin Time: 12.6

## 2011-06-02 LAB — B-NATRIURETIC PEPTIDE (CONVERTED LAB)
Pro B Natriuretic peptide (BNP): <30
Pro B Natriuretic peptide (BNP): <30

## 2011-06-02 LAB — LIPID PANEL
Cholesterol: 128
HDL: 25 — ABNORMAL LOW
LDL Cholesterol: 84
Total CHOL/HDL Ratio: 5.1
Triglycerides: 93
VLDL: 19

## 2011-06-02 LAB — COMPREHENSIVE METABOLIC PANEL
ALT: 33
AST: 32
Albumin: 4
Alkaline Phosphatase: 91
BUN: 16
CO2: 23
Calcium: 9.6
Chloride: 106
Creatinine, Ser: 0.84
GFR calc Af Amer: >60
GFR calc non Af Amer: >60
Glucose, Bld: 155 — ABNORMAL HIGH
Potassium: 4.1
Sodium: 139
Total Bilirubin: 0.7
Total Protein: 7.4

## 2011-06-02 LAB — TSH: TSH: 6.225 — ABNORMAL HIGH

## 2011-06-02 LAB — HEMOGLOBIN A1C
Hgb A1c MFr Bld: 5.7
Mean Plasma Glucose: 117

## 2011-06-02 LAB — APTT: aPTT: 33

## 2011-06-02 LAB — DIGOXIN LEVEL: Digoxin Level: 0.3 — ABNORMAL LOW

## 2011-06-02 LAB — MAGNESIUM
Magnesium: 2.1
Magnesium: 2.1

## 2011-10-31 ENCOUNTER — Other Ambulatory Visit (INDEPENDENT_AMBULATORY_CARE_PROVIDER_SITE_OTHER): Payer: Self-pay | Admitting: Surgery

## 2011-10-31 DIAGNOSIS — Z1231 Encounter for screening mammogram for malignant neoplasm of breast: Secondary | ICD-10-CM

## 2011-11-20 ENCOUNTER — Ambulatory Visit
Admission: RE | Admit: 2011-11-20 | Discharge: 2011-11-20 | Disposition: A | Discharge status: Home / Self Care | Payer: No Typology Code available for payment source | Source: Ambulatory Visit | Attending: Surgery | Admitting: Surgery

## 2011-11-20 DIAGNOSIS — Z1231 Encounter for screening mammogram for malignant neoplasm of breast: Secondary | ICD-10-CM

## 2012-10-27 ENCOUNTER — Other Ambulatory Visit (HOSPITAL_COMMUNITY): Payer: Self-pay | Admitting: Cardiovascular Disease

## 2012-10-27 DIAGNOSIS — R011 Cardiac murmur, unspecified: Secondary | ICD-10-CM

## 2012-10-27 DIAGNOSIS — I447 Left bundle-branch block, unspecified: Secondary | ICD-10-CM

## 2012-11-03 ENCOUNTER — Ambulatory Visit (HOSPITAL_COMMUNITY)
Admission: RE | Admit: 2012-11-03 | Discharge: 2012-11-03 | Disposition: A | Discharge status: Home / Self Care | Payer: BC Managed Care – PPO | Source: Ambulatory Visit | Attending: Cardiovascular Disease | Admitting: Cardiovascular Disease

## 2012-11-03 DIAGNOSIS — I447 Left bundle-branch block, unspecified: Secondary | ICD-10-CM | POA: Insufficient documentation

## 2012-11-03 DIAGNOSIS — R011 Cardiac murmur, unspecified: Secondary | ICD-10-CM

## 2012-11-03 NOTE — Progress Notes (Signed)
2D Echo Performed 11/03/2012    Clearence Ped, RCS.

## 2012-11-08 ENCOUNTER — Other Ambulatory Visit: Payer: Self-pay

## 2012-11-08 DIAGNOSIS — Z1231 Encounter for screening mammogram for malignant neoplasm of breast: Secondary | ICD-10-CM

## 2012-12-02 ENCOUNTER — Ambulatory Visit
Admission: RE | Admit: 2012-12-02 | Discharge: 2012-12-02 | Disposition: A | Discharge status: Home / Self Care | Payer: BC Managed Care – PPO | Source: Ambulatory Visit

## 2012-12-02 DIAGNOSIS — Z1231 Encounter for screening mammogram for malignant neoplasm of breast: Secondary | ICD-10-CM

## 2013-05-18 ENCOUNTER — Other Ambulatory Visit: Payer: Self-pay | Admitting: Obstetrics and Gynecology

## 2013-10-03 ENCOUNTER — Other Ambulatory Visit: Payer: Self-pay

## 2013-10-03 MED ORDER — DIGOXIN 125 MCG PO TABS: 125.0000 ug | ORAL_TABLET | Freq: Every day | ORAL | Status: DC

## 2013-10-03 NOTE — Telephone Encounter (Signed)
Rx was sent to pharmacy electronically. 

## 2013-11-02 ENCOUNTER — Other Ambulatory Visit: Payer: Self-pay | Admitting: Cardiovascular Disease

## 2013-11-02 NOTE — Telephone Encounter (Signed)
Rx was sent to pharmacy electronically. 

## 2013-12-01 ENCOUNTER — Other Ambulatory Visit: Payer: Self-pay

## 2013-12-01 DIAGNOSIS — Z1231 Encounter for screening mammogram for malignant neoplasm of breast: Secondary | ICD-10-CM

## 2013-12-21 ENCOUNTER — Ambulatory Visit
Admission: RE | Admit: 2013-12-21 | Discharge: 2013-12-21 | Disposition: A | Discharge status: Home / Self Care | Payer: Self-pay | Source: Ambulatory Visit

## 2013-12-21 ENCOUNTER — Encounter (INDEPENDENT_AMBULATORY_CARE_PROVIDER_SITE_OTHER): Payer: Self-pay

## 2013-12-21 DIAGNOSIS — Z1231 Encounter for screening mammogram for malignant neoplasm of breast: Secondary | ICD-10-CM

## 2013-12-23 ENCOUNTER — Other Ambulatory Visit: Payer: Self-pay | Admitting: Endocrinology

## 2013-12-23 DIAGNOSIS — R928 Other abnormal and inconclusive findings on diagnostic imaging of breast: Secondary | ICD-10-CM

## 2013-12-25 ENCOUNTER — Emergency Department (HOSPITAL_COMMUNITY): Payer: Medicare HMO

## 2013-12-25 ENCOUNTER — Emergency Department (HOSPITAL_COMMUNITY)
Admission: EM | Admit: 2013-12-25 | Discharge: 2013-12-25 | Disposition: A | Discharge status: Home / Self Care | Payer: Medicare HMO | Attending: Emergency Medicine | Admitting: Emergency Medicine

## 2013-12-25 ENCOUNTER — Encounter (HOSPITAL_COMMUNITY): Payer: Self-pay | Admitting: Emergency Medicine

## 2013-12-25 DIAGNOSIS — Y92009 Unspecified place in unspecified non-institutional (private) residence as the place of occurrence of the external cause: Secondary | ICD-10-CM | POA: Insufficient documentation

## 2013-12-25 DIAGNOSIS — I10 Essential (primary) hypertension: Secondary | ICD-10-CM | POA: Insufficient documentation

## 2013-12-25 DIAGNOSIS — R011 Cardiac murmur, unspecified: Secondary | ICD-10-CM | POA: Insufficient documentation

## 2013-12-25 DIAGNOSIS — X500XXA Overexertion from strenuous movement or load, initial encounter: Secondary | ICD-10-CM | POA: Insufficient documentation

## 2013-12-25 DIAGNOSIS — M129 Arthropathy, unspecified: Secondary | ICD-10-CM | POA: Insufficient documentation

## 2013-12-25 DIAGNOSIS — X503XXA Overexertion from repetitive movements, initial encounter: Secondary | ICD-10-CM | POA: Insufficient documentation

## 2013-12-25 DIAGNOSIS — M25512 Pain in left shoulder: Secondary | ICD-10-CM

## 2013-12-25 DIAGNOSIS — E785 Hyperlipidemia, unspecified: Secondary | ICD-10-CM | POA: Insufficient documentation

## 2013-12-25 DIAGNOSIS — Z880 Allergy status to penicillin: Secondary | ICD-10-CM | POA: Insufficient documentation

## 2013-12-25 DIAGNOSIS — S4980XA Other specified injuries of shoulder and upper arm, unspecified arm, initial encounter: Secondary | ICD-10-CM | POA: Insufficient documentation

## 2013-12-25 DIAGNOSIS — I059 Rheumatic mitral valve disease, unspecified: Secondary | ICD-10-CM | POA: Insufficient documentation

## 2013-12-25 DIAGNOSIS — Y93H2 Activity, gardening and landscaping: Secondary | ICD-10-CM | POA: Insufficient documentation

## 2013-12-25 DIAGNOSIS — Z79899 Other long term (current) drug therapy: Secondary | ICD-10-CM | POA: Insufficient documentation

## 2013-12-25 DIAGNOSIS — Z9889 Other specified postprocedural states: Secondary | ICD-10-CM | POA: Insufficient documentation

## 2013-12-25 DIAGNOSIS — E039 Hypothyroidism, unspecified: Secondary | ICD-10-CM | POA: Insufficient documentation

## 2013-12-25 DIAGNOSIS — Z7982 Long term (current) use of aspirin: Secondary | ICD-10-CM | POA: Insufficient documentation

## 2013-12-25 DIAGNOSIS — Z791 Long term (current) use of non-steroidal anti-inflammatories (NSAID): Secondary | ICD-10-CM | POA: Insufficient documentation

## 2013-12-25 DIAGNOSIS — S46909A Unspecified injury of unspecified muscle, fascia and tendon at shoulder and upper arm level, unspecified arm, initial encounter: Secondary | ICD-10-CM | POA: Insufficient documentation

## 2013-12-25 MED ORDER — PREDNISONE 20 MG PO TABS: 40.0000 mg | ORAL_TABLET | Freq: Every day | ORAL | Status: DC

## 2013-12-25 NOTE — ED Notes (Addendum)
Pt states she trimmed some hedges on 4/10 and on 4/11, began having left shoulder and arm pain.  Pt c/o numbness/tingling in left arm and hand.  Pt denies loss of bowel or bladder control.  Pt's left grip and LUE strength are weaker than right.  Pt denies altered mental status, N/V or fever.  Pt had cervical disc rupture and repair in 2005 (estimated).  Pt has appt with Dr. Sherwood Gambler (he did original surgery) for next week.

## 2013-12-25 NOTE — ED Provider Notes (Signed)
CSN: [id number]     Arrival date & time 12/25/13  1022 History   First MD Initiated Contact with Patient 12/25/13 1037     Chief Complaint  Patient presents with  . Shoulder Injury     (Consider location/radiation/quality/duration/timing/severity/associated sxs/prior Treatment) Patient is a 68 y.o. female presenting with shoulder injury. The history is provided by the patient and medical records.  Shoulder Injury Associated symptoms include arthralgias.   This is a 68 y.o. F with PMH significany for HTN, HLP, hypothyroidism, presenting to the ED for left shoulder pain intermittently over the past several weeks.  Pt states she was trimming bushes in her yard and developed pain a few days later associated with some paresthesias of her left arm.  States when she rests her arm, it will improve but upon returning to normal activity pain returns.  Pt also has hx of cervical spine disc herniations/p insertion of titanium plate, she suffered some permanent nerve damage in left arm from surgery.  States she does take care of her grandson and has to pick him up frequently.  Has seen her PCP about this and been placed on courses of prednisone twice which seemed to help.  She is also taking hydrocodone which helps with pain but states it makes her drowsy.  Pt denies any chest pain, SOB, dizziness, weakness, or numbness of left arm.  Pt has not had x-rays performed thus far.  States she has FU appt with PCP tomorrow and with Dr. Sherwood Gambler on Friday.  2D echo 11/03/12 with the following conclusions: - Left ventricle: The cavity size was normal. Wall thickness was normal. Systolic function was normal. The estimated ejection fraction was in the range of 55% to 60%. Wall motion was normal; there were no regional wall motion abnormalities. Doppler parameters are consistent with abnormal left ventricular relaxation (grade 1 diastolic dysfunction). - Mitral valve: Calcified annulus. Systolic bowing  without prolapse. Mild regurgitation. - Atrial septum: No defect or patent foramen ovale was identified.  Past Medical History  Diagnosis Date  . Hyperlipidemia   . Hypertension   . Thyroid disease   . Allergy     seasonal  . Arthritis   . Heart murmur     mvp   Past Surgical History  Procedure Laterality Date  . Abdominal hysterectomy    . Cholecystectomy    . Appendectomy    . Colonoscopy    . Tonsillectomy and adenoidectomy    . Anterior fusion cervical spine      3 cervical fusions   Family History  Problem Relation Age of Onset  . Colon cancer Sister   . Colon cancer Sister    History  Substance Use Topics  . Smoking status: Never Smoker   . Smokeless tobacco: Never Used  . Alcohol Use: No   OB History   Grav Para Term Preterm Abortions TAB SAB Ect Mult Living                 Review of Systems  Musculoskeletal: Positive for arthralgias.  All other systems reviewed and are negative.     Allergies  Butorphanol tartrate; Cardizem; Clarithromycin; Meperidine hcl; Naloxone; Penicillins; and Sulfonamide derivatives  Home Medications   Prior to Admission medications   Medication Sig Start Date End Date Taking? Authorizing Provider  aspirin 81 MG tablet Take 81 mg by mouth daily.      Historical Provider, MD  cyclobenzaprine (FLEXERIL) 10 MG tablet Take 10 mg by mouth daily.  Historical Provider, MD  diclofenac (VOLTAREN) 75 MG EC tablet Take 75 mg by mouth daily.      Historical Provider, MD  fish oil-omega-3 fatty acids 1000 MG capsule Take 1 g by mouth 2 (two) times daily.      Historical Provider, MD  glucosamine-chondroitin 500-400 MG tablet Take 1 tablet by mouth 2 (two) times daily.      Historical Provider, MD  LANOXIN 125 MCG tablet TAKE ONE TABLET BY MOUTH ONE TIME DAILY 11/02/13   Lorretta Harp, MD  levothyroxine (SYNTHROID, LEVOTHROID) 100 MCG tablet Take 100 mcg by mouth daily.      Historical Provider, MD  loratadine (CLARITIN) 10 MG  tablet Take 10 mg by mouth daily.      Historical Provider, MD  metoprolol (LOPRESSOR) 100 MG tablet Take 100 mg by mouth 2 (two) times daily.      Historical Provider, MD  pregabalin (LYRICA) 50 MG capsule Take 50 mg by mouth 2 (two) times daily.      Historical Provider, MD  ramipril (ALTACE) 10 MG tablet Take 10 mg by mouth 2 (two) times daily.      Historical Provider, MD  simvastatin (ZOCOR) 20 MG tablet Take 20 mg by mouth at bedtime.      Historical Provider, MD  Triamterene-HCTZ (MAXZIDE-25 PO) Take 25 mg by mouth as needed. 3 times a week    Historical Provider, MD   BP 173/79  Pulse 108  Temp(Src) 98.4 F (36.9 C) (Oral)  Resp 16  SpO2 100%  Physical Exam  Nursing note and vitals reviewed. Constitutional: She is oriented to person, place, and time. She appears well-developed and well-nourished. No distress.  HENT:  Head: Normocephalic and atraumatic.  Mouth/Throat: Oropharynx is clear and moist.  Eyes: Conjunctivae and EOM are normal. Pupils are equal, round, and reactive to light.  Neck: Normal range of motion. Neck supple.  Cardiovascular: Normal rate, regular rhythm and normal heart sounds.   Pulmonary/Chest: Effort normal and breath sounds normal. No respiratory distress. She has no wheezes.  Musculoskeletal: Normal range of motion.       Left shoulder: She exhibits tenderness, bony tenderness and pain. She exhibits normal range of motion, no swelling, no effusion, no crepitus, no deformity, no laceration, no spasm, normal pulse and normal strength.       Arms: Left shoulder with tenderness along posterior scapula; no gross deformity; full ROM in all directions without difficulty; negative empty can test; normal strength against resistance; strong radial pulse and cap refill; sensation intact diffusely throughout arm and hand  Neurological: She is alert and oriented to person, place, and time.  Skin: Skin is warm and dry. She is not diaphoretic.  Psychiatric: She has a  normal mood and affect.    ED Course  Procedures (including critical care time) Labs Review Labs Reviewed - No data to display  Imaging Review Dg Shoulder Left  12/25/2013   CLINICAL DATA:  Posterior scapular pain  EXAM: LEFT SHOULDER - 2+ VIEW  COMPARISON:  None.  FINDINGS: There is no evidence of fracture or dislocation. There is no evidence of arthropathy or other focal bone abnormality. Soft tissues are unremarkable. Incompletely imaged anterior cervical stabilization hardware.  IMPRESSION: Negative.   Electronically Signed   By: Jacqulynn Cadet M.D.   On: 12/25/2013 11:40     EKG Interpretation None      MDM   Final diagnoses:  Left shoulder pain   X-rays negative for acute abnormality. Patient  has no numbness or weakness of left arm. She has no known associated chest pain or jaw pain. I do not think her symptoms are indicative of atypical ACS, PE, dissection, or other acute cardiac event.  Will start another steroid taper, continue home pain meds. She has previously scheduled followup with her primary care physician tomorrow and neurosurgery followup on Friday-- encouraged to keep these appts.  Discussed plan with patient, he/she acknowledged understanding and agreed with plan of care.  Return precautions given for new or worsening symptoms.  Larene Pickett, PA-C 12/25/13 1429

## 2013-12-25 NOTE — ED Notes (Signed)
Per pt, had been cutting bushes Around April 11th.  Pt since has had shoulder pain.  Has appt with neurology next week.  C/o pain in left shoulder arm.  No chest pain or jaw pain.  Pt states she also cares for toddler of 20 pounds or so and is constantly lifting arm.

## 2013-12-25 NOTE — Discharge Instructions (Signed)
Take the prescribed medication as directed.  May continue home pain meds with prednisone. Follow-up with Dr. Sherwood Gambler at your previously scheduled appt on Friday. Return to the ED for new or worsening symptoms.

## 2013-12-27 NOTE — ED Provider Notes (Signed)
Medical screening examination/treatment/procedure(s) were performed by non-physician practitioner and as supervising physician I was immediately available for consultation/collaboration.    Idamae Coccia L Priscille Shadduck, MD 12/27/13 1106 

## 2013-12-28 ENCOUNTER — Ambulatory Visit
Admission: RE | Admit: 2013-12-28 | Discharge: 2013-12-28 | Disposition: A | Discharge status: Home / Self Care | Payer: Medicare HMO | Source: Ambulatory Visit | Attending: Endocrinology | Admitting: Endocrinology

## 2013-12-28 DIAGNOSIS — R928 Other abnormal and inconclusive findings on diagnostic imaging of breast: Secondary | ICD-10-CM

## 2014-01-04 ENCOUNTER — Other Ambulatory Visit: Payer: Medicare HMO

## 2014-09-18 DIAGNOSIS — R739 Hyperglycemia, unspecified: Secondary | ICD-10-CM | POA: Diagnosis not present

## 2014-09-25 DIAGNOSIS — E039 Hypothyroidism, unspecified: Secondary | ICD-10-CM | POA: Diagnosis not present

## 2014-09-25 DIAGNOSIS — E789 Disorder of lipoprotein metabolism, unspecified: Secondary | ICD-10-CM | POA: Diagnosis not present

## 2014-10-24 DIAGNOSIS — D241 Benign neoplasm of right breast: Secondary | ICD-10-CM | POA: Diagnosis not present

## 2015-01-04 ENCOUNTER — Other Ambulatory Visit: Payer: Self-pay

## 2015-01-04 DIAGNOSIS — Z1231 Encounter for screening mammogram for malignant neoplasm of breast: Secondary | ICD-10-CM

## 2015-01-17 ENCOUNTER — Ambulatory Visit
Admission: RE | Admit: 2015-01-17 | Discharge: 2015-01-17 | Disposition: A | Discharge status: Home / Self Care | Payer: Medicare Other | Source: Ambulatory Visit

## 2015-01-17 ENCOUNTER — Ambulatory Visit: Payer: Medicare HMO

## 2015-01-17 DIAGNOSIS — Z1231 Encounter for screening mammogram for malignant neoplasm of breast: Secondary | ICD-10-CM | POA: Diagnosis not present

## 2015-02-13 DIAGNOSIS — L600 Ingrowing nail: Secondary | ICD-10-CM | POA: Diagnosis not present

## 2015-02-13 DIAGNOSIS — L03031 Cellulitis of right toe: Secondary | ICD-10-CM | POA: Diagnosis not present

## 2015-03-13 DIAGNOSIS — B9689 Other specified bacterial agents as the cause of diseases classified elsewhere: Secondary | ICD-10-CM | POA: Diagnosis not present

## 2015-03-13 DIAGNOSIS — L0202 Furuncle of face: Secondary | ICD-10-CM | POA: Diagnosis not present

## 2015-03-13 DIAGNOSIS — D225 Melanocytic nevi of trunk: Secondary | ICD-10-CM | POA: Diagnosis not present

## 2015-03-19 DIAGNOSIS — E789 Disorder of lipoprotein metabolism, unspecified: Secondary | ICD-10-CM | POA: Diagnosis not present

## 2015-03-19 DIAGNOSIS — E119 Type 2 diabetes mellitus without complications: Secondary | ICD-10-CM | POA: Diagnosis not present

## 2015-04-10 DIAGNOSIS — E789 Disorder of lipoprotein metabolism, unspecified: Secondary | ICD-10-CM | POA: Diagnosis not present

## 2015-04-10 DIAGNOSIS — E032 Hypothyroidism due to medicaments and other exogenous substances: Secondary | ICD-10-CM | POA: Diagnosis not present

## 2015-05-29 DIAGNOSIS — E789 Disorder of lipoprotein metabolism, unspecified: Secondary | ICD-10-CM | POA: Diagnosis not present

## 2015-05-29 DIAGNOSIS — R05 Cough: Secondary | ICD-10-CM | POA: Diagnosis not present

## 2015-07-03 DIAGNOSIS — J189 Pneumonia, unspecified organism: Secondary | ICD-10-CM | POA: Diagnosis not present

## 2015-08-06 DIAGNOSIS — E119 Type 2 diabetes mellitus without complications: Secondary | ICD-10-CM | POA: Diagnosis not present

## 2015-08-06 DIAGNOSIS — E789 Disorder of lipoprotein metabolism, unspecified: Secondary | ICD-10-CM | POA: Diagnosis not present

## 2015-08-13 DIAGNOSIS — E039 Hypothyroidism, unspecified: Secondary | ICD-10-CM | POA: Diagnosis not present

## 2015-08-13 DIAGNOSIS — E118 Type 2 diabetes mellitus with unspecified complications: Secondary | ICD-10-CM | POA: Diagnosis not present

## 2015-08-13 DIAGNOSIS — E789 Disorder of lipoprotein metabolism, unspecified: Secondary | ICD-10-CM | POA: Diagnosis not present

## 2015-09-11 DIAGNOSIS — R05 Cough: Secondary | ICD-10-CM | POA: Diagnosis not present

## 2015-10-29 DIAGNOSIS — T887XXA Unspecified adverse effect of drug or medicament, initial encounter: Secondary | ICD-10-CM | POA: Diagnosis not present

## 2015-10-31 ENCOUNTER — Encounter: Payer: Self-pay | Admitting: Gastroenterology

## 2015-12-03 DIAGNOSIS — E119 Type 2 diabetes mellitus without complications: Secondary | ICD-10-CM | POA: Diagnosis not present

## 2015-12-03 DIAGNOSIS — E789 Disorder of lipoprotein metabolism, unspecified: Secondary | ICD-10-CM | POA: Diagnosis not present

## 2015-12-10 DIAGNOSIS — M47816 Spondylosis without myelopathy or radiculopathy, lumbar region: Secondary | ICD-10-CM | POA: Diagnosis not present

## 2015-12-10 DIAGNOSIS — M5430 Sciatica, unspecified side: Secondary | ICD-10-CM | POA: Diagnosis not present

## 2015-12-10 DIAGNOSIS — Z780 Asymptomatic menopausal state: Secondary | ICD-10-CM | POA: Diagnosis not present

## 2015-12-17 DIAGNOSIS — Z01419 Encounter for gynecological examination (general) (routine) without abnormal findings: Secondary | ICD-10-CM | POA: Diagnosis not present

## 2015-12-25 ENCOUNTER — Encounter: Payer: Self-pay | Admitting: Gastroenterology

## 2016-01-10 ENCOUNTER — Emergency Department (HOSPITAL_COMMUNITY)
Admission: EM | Admit: 2016-01-10 | Discharge: 2016-01-10 | Disposition: A | Discharge status: Home / Self Care | Payer: Medicare Other | Attending: Emergency Medicine | Admitting: Emergency Medicine

## 2016-01-10 ENCOUNTER — Telehealth: Payer: Self-pay | Admitting: Internal Medicine

## 2016-01-10 ENCOUNTER — Encounter (HOSPITAL_COMMUNITY): Payer: Self-pay | Admitting: Emergency Medicine

## 2016-01-10 DIAGNOSIS — R000 Tachycardia, unspecified: Secondary | ICD-10-CM | POA: Insufficient documentation

## 2016-01-10 DIAGNOSIS — N289 Disorder of kidney and ureter, unspecified: Secondary | ICD-10-CM | POA: Insufficient documentation

## 2016-01-10 DIAGNOSIS — I10 Essential (primary) hypertension: Secondary | ICD-10-CM | POA: Diagnosis not present

## 2016-01-10 DIAGNOSIS — R002 Palpitations: Secondary | ICD-10-CM | POA: Diagnosis not present

## 2016-01-10 DIAGNOSIS — R739 Hyperglycemia, unspecified: Secondary | ICD-10-CM | POA: Diagnosis not present

## 2016-01-10 DIAGNOSIS — Z791 Long term (current) use of non-steroidal anti-inflammatories (NSAID): Secondary | ICD-10-CM | POA: Diagnosis not present

## 2016-01-10 DIAGNOSIS — R011 Cardiac murmur, unspecified: Secondary | ICD-10-CM | POA: Diagnosis not present

## 2016-01-10 DIAGNOSIS — Z7951 Long term (current) use of inhaled steroids: Secondary | ICD-10-CM | POA: Diagnosis not present

## 2016-01-10 DIAGNOSIS — G8929 Other chronic pain: Secondary | ICD-10-CM | POA: Insufficient documentation

## 2016-01-10 DIAGNOSIS — E785 Hyperlipidemia, unspecified: Secondary | ICD-10-CM | POA: Insufficient documentation

## 2016-01-10 DIAGNOSIS — Z7982 Long term (current) use of aspirin: Secondary | ICD-10-CM | POA: Diagnosis not present

## 2016-01-10 LAB — CBC WITH DIFFERENTIAL/PLATELET
Basophils Absolute: 0 10*3/uL (ref 0.0–0.1)
Basophils Relative: 0 %
Eosinophils Absolute: 0.1 10*3/uL (ref 0.0–0.7)
Eosinophils Relative: 1 %
HCT: 35.4 % — ABNORMAL LOW (ref 36.0–46.0)
Hemoglobin: 11.6 g/dL — ABNORMAL LOW (ref 12.0–15.0)
Lymphocytes Relative: 17 %
Lymphs Abs: 1.9 10*3/uL (ref 0.7–4.0)
MCH: 29.3 pg (ref 26.0–34.0)
MCHC: 32.8 g/dL (ref 30.0–36.0)
MCV: 89.4 fL (ref 78.0–100.0)
Monocytes Absolute: 0.5 10*3/uL (ref 0.1–1.0)
Monocytes Relative: 5 %
Neutro Abs: 8.8 10*3/uL — ABNORMAL HIGH (ref 1.7–7.7)
Neutrophils Relative %: 78 %
Platelets: 267 10*3/uL (ref 150–400)
RBC: 3.96 MIL/uL (ref 3.87–5.11)
RDW: 13.5 % (ref 11.5–15.5)
WBC: 11.3 10*3/uL — ABNORMAL HIGH (ref 4.0–10.5)

## 2016-01-10 LAB — I-STAT TROPONIN, ED: Troponin i, poc: 0 ng/mL (ref 0.00–0.08)

## 2016-01-10 LAB — BASIC METABOLIC PANEL
Anion gap: 14 (ref 5–15)
BUN: 22 mg/dL — ABNORMAL HIGH (ref 6–20)
CO2: 21 mmol/L — ABNORMAL LOW (ref 22–32)
Calcium: 9.7 mg/dL (ref 8.9–10.3)
Chloride: 102 mmol/L (ref 101–111)
Creatinine, Ser: 1.08 mg/dL — ABNORMAL HIGH (ref 0.44–1.00)
GFR calc Af Amer: >60 mL/min (ref >60)
GFR calc non Af Amer: 52 mL/min — ABNORMAL LOW (ref >60)
Glucose, Bld: 194 mg/dL — ABNORMAL HIGH (ref 65–99)
Potassium: 4.2 mmol/L (ref 3.5–5.1)
Sodium: 137 mmol/L (ref 135–145)

## 2016-01-10 LAB — TSH: TSH: 0.937 u[IU]/mL (ref 0.350–4.500)

## 2016-01-10 LAB — DIGOXIN LEVEL: Digoxin Level: 0.7 ng/mL — ABNORMAL LOW (ref 0.8–2.0)

## 2016-01-10 MED ORDER — SODIUM CHLORIDE 0.9 % IV BOLUS (SEPSIS)
1000.0000 mL | Freq: Once | INTRAVENOUS | Status: AC
  Administered 2016-01-10: 1000 mL via INTRAVENOUS

## 2016-01-10 MED ORDER — ASPIRIN 81 MG PO CHEW
81.0000 mg | CHEWABLE_TABLET | Freq: Once | ORAL | Status: AC
  Administered 2016-01-10: 81 mg via ORAL
  Filled 2016-01-10: qty 1

## 2016-01-10 MED ORDER — METOPROLOL TARTRATE 25 MG PO TABS
100.0000 mg | ORAL_TABLET | ORAL | Status: AC
  Administered 2016-01-10: 100 mg via ORAL
  Filled 2016-01-10: qty 4

## 2016-01-10 NOTE — ED Notes (Signed)
Pt. reports palpitations onset last night , denies chest pain or SOB .

## 2016-01-10 NOTE — ED Provider Notes (Signed)
CSN: [id number]     Arrival date & time 01/10/16  0535 History   First MD Initiated Contact with Patient 01/10/16 [phone]     Chief Complaint  Patient presents with  . Palpitations     (Consider location/radiation/quality/duration/timing/severity/associated sxs/prior Treatment) The history is provided by the patient and the spouse.     Pt with hx HTN, HLD, thyroid disease, mitral valve prolapse, palpitations presents today with palpitations.  States she has palpitations nearly every day but the palpitations that began last night have lasted longer and are more intense than usual.  She has had these symptoms previously but it is more rare.  Last night the symptoms began when she was in bed asleep and her spouse came to bed and woke her up.  It has persisted all night.  States her heart feels like it is fluttering and skipping beats.  Last night this happened was several years ago and she was seen at [facility] and found out her "thyroid was out of whack."   She was awake all night as the symptoms make her very nervous.  She was instructed to take an extra metoprolol by the on call [place] MD, states she took half.  She also took a "nerve pill" (Tranxene).  Has not taken her morning medications.  Recently she has been taking care of two small children three times/week, including yesterday.  Her allergies have also been acting up.  Otherwise, she has been well.  She has chronic swelling in her bilateral ankles, L>R x years.  Was on prednisone 2-3 weeks ago for lumbar radiculopathy.  Denies fevers, recent illness, CP, SOB, lightheadedness/dizziness, generalized weakness.  Denies recent immobilization, exogenous estrogen, hx blood clot.    Past Medical History  Diagnosis Date  . Hyperlipidemia   . Hypertension   . Thyroid disease   . Allergy     seasonal  . Arthritis   . Heart murmur     mvp  . History of chronic back pain   . Palpitations    Past Surgical History  Procedure Laterality Date  .  Abdominal hysterectomy    . Cholecystectomy    . Appendectomy    . Colonoscopy    . Tonsillectomy and adenoidectomy    . Anterior fusion cervical spine      3 cervical fusions   Family History  Problem Relation Age of Onset  . Colon cancer Sister   . Colon cancer Sister    Social History  Substance Use Topics  . Smoking status: Never Smoker   . Smokeless tobacco: Never Used  . Alcohol Use: No   OB History    No data available     Review of Systems  All other systems reviewed and are negative.     Allergies  Butorphanol tartrate; Cardizem; Clarithromycin; Meperidine hcl; Naloxone; Penicillins; and Sulfonamide derivatives  Home Medications   Prior to Admission medications   Medication Sig Start Date End Date Taking? Authorizing Provider  aspirin 81 MG tablet Take 81 mg by mouth daily.      Historical Provider, MD  cyclobenzaprine (FLEXERIL) 10 MG tablet Take 10 mg by mouth daily.      Historical Provider, MD  diclofenac (VOLTAREN) 75 MG EC tablet Take 75 mg by mouth daily.      Historical Provider, MD  fish oil-omega-3 fatty acids 1000 MG capsule Take 1 g by mouth daily.     Historical Provider, MD  glucosamine-chondroitin 500-400 MG tablet Take 1 tablet by  mouth 2 (two) times daily.      Historical Provider, MD  LANOXIN 125 MCG tablet TAKE ONE TABLET BY MOUTH ONE TIME DAILY 11/02/13   Lorretta Harp, MD  levothyroxine (SYNTHROID, LEVOTHROID) 100 MCG tablet Take 100 mcg by mouth daily.      Historical Provider, MD  loratadine (CLARITIN) 10 MG tablet Take 10 mg by mouth daily as needed for allergies.     Historical Provider, MD  metoprolol (LOPRESSOR) 100 MG tablet Take 100 mg by mouth 2 (two) times daily.      Historical Provider, MD  predniSONE (DELTASONE) 20 MG tablet Take 2 tablets (40 mg total) by mouth daily. Take 40 mg by mouth daily for 3 days, then 20mg  by mouth daily for 3 days, then 10mg  daily for 3 days 12/25/13   Larene Pickett, PA-C  ramipril (ALTACE) 10 MG  tablet Take 10 mg by mouth 2 (two) times daily.      Historical Provider, MD  rosuvastatin (CRESTOR) 20 MG tablet Take 20 mg by mouth daily.    Historical Provider, MD  Triamterene-HCTZ (MAXZIDE-25 PO) Take 25 mg by mouth as needed. 3 times a week    Historical Provider, MD   BP 177/90 mmHg  Pulse 117  Temp(Src) 97.5 F (36.4 C) (Oral)  Resp 16  Ht 5\' 7"  (1.702 m)  Wt 73.029 kg  BMI 25.21 kg/m2  SpO2 97% Physical Exam  Constitutional: She appears well-developed and well-nourished. No distress.  HENT:  Head: Normocephalic and atraumatic.  Neck: Neck supple.  Cardiovascular: Regular rhythm.  Tachycardia present.   Pulmonary/Chest: Effort normal and breath sounds normal. No respiratory distress. She has no wheezes. She has no rales.  Abdominal: Soft. She exhibits no distension. There is no tenderness. There is no rebound and no guarding.  Musculoskeletal: She exhibits edema (mild bilateral ankle edema, symmetric ).  Neurological: She is alert.  Skin: She is not diaphoretic.  Nursing note and vitals reviewed.   ED Course  Procedures (including critical care time) Labs Review Labs Reviewed  BASIC METABOLIC PANEL - Abnormal; Notable for the following:    CO2 21 (*)    Glucose, Bld 194 (*)    BUN 22 (*)    Creatinine, Ser 1.08 (*)    GFR calc non Af Amer 52 (*)    All other components within normal limits  CBC WITH DIFFERENTIAL/PLATELET - Abnormal; Notable for the following:    WBC 11.3 (*)    Hemoglobin 11.6 (*)    HCT 35.4 (*)    Neutro Abs 8.8 (*)    All other components within normal limits  DIGOXIN LEVEL - Abnormal; Notable for the following:    Digoxin Level 0.7 (*)    All other components within normal limits  TSH  I-STAT TROPOININ, ED    Imaging Review No results found. I have personally reviewed and evaluated these images and lab results as part of my medical decision-making.   EKG Interpretation   Date/Time:  Thursday Jan 10 2016 05:43:55 EDT Ventricular  Rate:  117 PR Interval:  174 QRS Duration: 140 QT Interval:  326 QTC Calculation: 454 R Axis:   101 Text Interpretation:  Sinus tachycardia Right bundle branch block T wave  abnormality, consider inferior ischemia Abnormal ECG tachycardia new from  previous Confirmed by LITTLE MD, RACHEL [id number]) on 01/10/2016 7:06:14 AM      MDM   Final diagnoses:  Palpitations  Hyperglycemia  Renal insufficiency    Afebrile, nontoxic  patient with palpitations that began last night when her spouse accidentally woke her up, perhaps startling her.  Labs demonstrate hyperglycemia and renal insufficiency.  Hyperglycemia is being monitored at home and by Dr Wilson Singer. Notes she has been eating a lot of strawberries recently and was also recently on prednisone.   Renal insufficiency is in comparison to labs from 2009, unclear if this is an acute change or more gradual.  TSH is normal.  Digoxin level is low. EKG unchanged from prior.  Pt reports she is feeling much better with time and IVF, home medications.  HR much improved.  Pt also seen by Dr Rex Kras.  Pt does not have known risk factors for PE. She has no symptoms other than palpitations, doubt ACS.  D/C home with close PCP follow up, increased hydration, discussed diabetic diet.  Discussed result, findings, treatment, and follow up  with patient.  Pt given return precautions.  Pt verbalizes understanding and agrees with plan.         I doubt any other EMC precluding discharge at this time including, but not necessarily limited to the following:  ACS, PE, aortic dissection, pneuothorax, pneumonia.     Clayton Bibles, PA-C 01/10/16 [place], MD 01/14/16 [phone]

## 2016-01-10 NOTE — ED Notes (Signed)
Pt ambulatory to room D30 from waiting room; steady gait noted

## 2016-01-10 NOTE — Discharge Instructions (Signed)
Read the information below.  You may return to the Emergency Department at any time for worsening condition or any new symptoms that concern you.  If you develop palpitations, or any chest pain, shortness of breath, fever, you pass out, or become weak or dizzy, return to the ER for a recheck.   Please drink plenty of water over the next few days.  Monitor your blood sugars daily and discuss these with Dr Wilson Singer at your next visit.  Please call this week for a close follow up appointment.     Palpitations A palpitation is the feeling that your heartbeat is irregular or is faster than normal. It may feel like your heart is fluttering or skipping a beat. Palpitations are usually not a serious problem. However, in some cases, you may need further medical evaluation. CAUSES  Palpitations can be caused by:  Smoking.  Caffeine or other stimulants, such as diet pills or energy drinks.  Alcohol.  Stress and anxiety.  Strenuous physical activity.  Fatigue.  Certain medicines.  Heart disease, especially if you have a history of irregular heart rhythms (arrhythmias), such as atrial fibrillation, atrial flutter, or supraventricular tachycardia.  An improperly working pacemaker or defibrillator. DIAGNOSIS  To find the cause of your palpitations, your health care provider will take your medical history and perform a physical exam. Your health care provider may also have you take a test called an ambulatory electrocardiogram (ECG). An ECG records your heartbeat patterns over a 24-hour period. You may also have other tests, such as:  Transthoracic echocardiogram (TTE). During echocardiography, sound waves are used to evaluate how blood flows through your heart.  Transesophageal echocardiogram (TEE).  Cardiac monitoring. This allows your health care provider to monitor your heart rate and rhythm in real time.  Holter monitor. This is a portable device that records your heartbeat and can help diagnose  heart arrhythmias. It allows your health care provider to track your heart activity for several days, if needed.  Stress tests by exercise or by giving medicine that makes the heart beat faster. TREATMENT  Treatment of palpitations depends on the cause of your symptoms and can vary greatly. Most cases of palpitations do not require any treatment other than time, relaxation, and monitoring your symptoms. Other causes, such as atrial fibrillation, atrial flutter, or supraventricular tachycardia, usually require further treatment. HOME CARE INSTRUCTIONS   Avoid:  Caffeinated coffee, tea, soft drinks, diet pills, and energy drinks.  Chocolate.  Alcohol.  Stop smoking if you smoke.  Reduce your stress and anxiety. Things that can help you relax include:  A method of controlling things in your body, such as your heartbeats, with your mind (biofeedback).  Yoga.  Meditation.  Physical activity such as swimming, jogging, or walking.  Get plenty of rest and sleep. SEEK MEDICAL CARE IF:   You continue to have a fast or irregular heartbeat beyond 24 hours.  Your palpitations occur more often. SEEK IMMEDIATE MEDICAL CARE IF:  You have chest pain or shortness of breath.  You have a severe headache.  You feel dizzy or you faint. MAKE SURE YOU:  Understand these instructions.  Will watch your condition.  Will get help right away if you are not doing well or get worse.   This information is not intended to replace advice given to you by your health care provider. Make sure you discuss any questions you have with your health care provider.   Document Released: 08/15/2000 Document Revised: 08/23/2013 Document Reviewed: 10/17/2011  Elsevier Interactive Patient Education 2016 [place].  Hyperglycemia High blood sugar (hyperglycemia) means that the level of sugar in your blood is higher than it should be. Signs of high blood sugar include:  Feeling thirsty.  Frequent peeing  (urinating).  Feeling tired or sleepy.  Dry mouth.  Vision changes.  Feeling weak.  Feeling hungry but losing weight.  Numbness and tingling in your hands or feet.  Headache. When you ignore these signs, your blood sugar may keep going up. These problems may get worse, and other problems may begin. HOME CARE  Check your blood sugars as told by your doctor. Write down the numbers with the date and time.  Take the right amount of insulin or diabetes pills at the right time. Write down the dose with date and time.  Refill your insulin or diabetes pills before running out.  Watch what you eat. Follow your meal plan.  Drink liquids without sugar, such as water. Check with your doctor if you have kidney or heart disease.  Follow your doctor's orders for exercise. Exercise at the same time of day.  Keep your doctor's appointments. GET HELP RIGHT AWAY IF:   You have trouble thinking or are confused.  You have fast breathing with fruity smelling breath.  You pass out (faint).  You have 2 to 3 days of high blood sugars and you do not know why.  You have chest pain.  You are feeling sick to your stomach (nauseous) or throwing up (vomiting).  You have sudden vision changes. MAKE SURE YOU:   Understand these instructions.  Will watch your condition.  Will get help right away if you are not doing well or get worse.   This information is not intended to replace advice given to you by your health care provider. Make sure you discuss any questions you have with your health care provider.   Document Released: 06/15/2009 Document Revised: 09/08/2014 Document Reviewed: 04/24/2015 Elsevier Interactive Patient Education [employer].

## 2016-01-10 NOTE — ED Notes (Signed)
PA at bedside.

## 2016-01-10 NOTE — Telephone Encounter (Signed)
Called patient because she is having palpitations. She said that she has been having palpitations tonight and otherwise she doe snot have any SOB, chest pain, dizziness, syncope, presyncope. Told her to take 50 mg of extra metoprolol and if symptoms do not go away, come to the ED. She showed understanding.

## 2016-01-14 DIAGNOSIS — E118 Type 2 diabetes mellitus with unspecified complications: Secondary | ICD-10-CM | POA: Diagnosis not present

## 2016-01-14 DIAGNOSIS — R002 Palpitations: Secondary | ICD-10-CM | POA: Diagnosis not present

## 2016-01-23 ENCOUNTER — Encounter: Payer: Self-pay | Admitting: Cardiovascular Disease

## 2016-01-23 ENCOUNTER — Ambulatory Visit (INDEPENDENT_AMBULATORY_CARE_PROVIDER_SITE_OTHER): Payer: Medicare Other | Admitting: Cardiovascular Disease

## 2016-01-23 VITALS — BP 180/96 | HR 89 | Ht 67.0 in | Wt 162.4 lb

## 2016-01-23 DIAGNOSIS — I4510 Unspecified right bundle-branch block: Secondary | ICD-10-CM

## 2016-01-23 DIAGNOSIS — I10 Essential (primary) hypertension: Secondary | ICD-10-CM | POA: Diagnosis not present

## 2016-01-23 DIAGNOSIS — E039 Hypothyroidism, unspecified: Secondary | ICD-10-CM | POA: Diagnosis not present

## 2016-01-23 DIAGNOSIS — R600 Localized edema: Secondary | ICD-10-CM

## 2016-01-23 DIAGNOSIS — E785 Hyperlipidemia, unspecified: Secondary | ICD-10-CM

## 2016-01-23 MED ORDER — METOPROLOL TARTRATE 100 MG PO TABS: ORAL_TABLET | ORAL | Status: DC

## 2016-01-23 MED ORDER — VALSARTAN 160 MG PO TABS: 160.0000 mg | ORAL_TABLET | Freq: Every day | ORAL | Status: DC

## 2016-01-23 NOTE — Patient Instructions (Addendum)
Your physician has recommended you make the following change in your medication:   1.) the lopressor has been increased to 1.5 in the morning and 1 tablet in the evening.  2.) start new prescription for valsartan.  Your physician has requested that you have an echocardiogram. Echocardiography is a painless test that uses sound waves to create images of your heart. It provides your doctor with information about the size and shape of your heart and how well your heart's chambers and valves are working. This procedure takes approximately one hour. There are no restrictions for this procedure.   Your physician recommends that you schedule a follow-up appointment in: 6-8 weeks.

## 2016-01-27 ENCOUNTER — Encounter: Payer: Self-pay | Admitting: Cardiovascular Disease

## 2016-01-27 DIAGNOSIS — R600 Localized edema: Secondary | ICD-10-CM | POA: Insufficient documentation

## 2016-01-27 DIAGNOSIS — I4510 Unspecified right bundle-branch block: Secondary | ICD-10-CM | POA: Insufficient documentation

## 2016-01-27 DIAGNOSIS — E785 Hyperlipidemia, unspecified: Secondary | ICD-10-CM | POA: Insufficient documentation

## 2016-01-27 DIAGNOSIS — I10 Essential (primary) hypertension: Secondary | ICD-10-CM | POA: Insufficient documentation

## 2016-01-27 DIAGNOSIS — E039 Hypothyroidism, unspecified: Secondary | ICD-10-CM | POA: Insufficient documentation

## 2016-01-27 NOTE — Progress Notes (Signed)
Patient ID: Misty Newman, female   DOB: [birth year], 70 y.o.   MRN: [id number]     Primary MD: Dr. Gareth Eagle  PATIENT PROFILE: Misty Newman is a 70 y.o. female who remotely had seen Dr. Rollene Fare many years ago.  She was recently evaluated in the emergency room with palpitations.  She is referred by Dr. Wilson Singer for cardiology reestablishment.   HPI:  Misty Newman has a history of hypertension, hyperlipidemia, thyroid disease, mitral valve prolapse, and palpitations.  Remotely, she had been on Ramipril for blood pressure control, but she states that this was discharged one year ago.  She also has been taking Maxide recently every other day.  She was recently evaluated in the emergency room on 01/10/2016 when after she was awakened by her husband from sleep.  She began to notice palpitations.  Her initial ECG had shown sinus tachycardia at 117 bpm with right bundle branch block and inferior T-wave abnormality.  She admits to chronic swelling in her ankles bilaterally.  She has issues with arthritis involving her back and has been taking Voltaren 75 mg daily.  She is a history of hypothyroidism and has been on thyroid replacement with levothyroxine 100 g.  Recently she has been on metoprolol 100 mg twice a day for her palpitations in a long time ago was also started on Lanoxin by Dr. Rollene Fare.  She has a history of hyperlipidemia and has been on Crestor 10 mg.  She presents to reestablish cardiology care.  Past Medical History  Diagnosis Date  . Hyperlipidemia   . Hypertension   . Thyroid disease   . Allergy     seasonal  . Arthritis   . Heart murmur     mvp  . History of chronic back pain   . Palpitations     Past Surgical History  Procedure Laterality Date  . Abdominal hysterectomy    . Cholecystectomy    . Appendectomy    . Colonoscopy    . Tonsillectomy and adenoidectomy    . Anterior fusion cervical spine      3 cervical fusions    Allergies  Allergen Reactions  .  Butorphanol Tartrate     REACTION: sweating, increased heartrate  . Cardizem [Diltiazem Hcl] Other (See Comments)    Can't remember  . Clarithromycin     Can't remember  . Meperidine Hcl     REACTION: itching    . Naloxone     Pt questions allergies, is unaware of ever having, does not sound familiar  . Penicillins     REACTION: unspecified  . Sulfonamide Derivatives     REACTION: does not remember    Current Outpatient Prescriptions  Medication Sig Dispense Refill  . aspirin 81 MG tablet Take 81 mg by mouth daily.      . cyclobenzaprine (FLEXERIL) 10 MG tablet Take 10 mg by mouth daily.      . diclofenac (VOLTAREN) 75 MG EC tablet Take 75 mg by mouth daily.      . fish oil-omega-3 fatty acids 1000 MG capsule Take 1 g by mouth daily.     Marland Kitchen glucosamine-chondroitin 500-400 MG tablet Take 1 tablet by mouth 2 (two) times daily.      Marland Kitchen LANOXIN 125 MCG tablet TAKE ONE TABLET BY MOUTH ONE TIME DAILY 15 tablet 0  . levothyroxine (SYNTHROID, LEVOTHROID) 100 MCG tablet Take 100 mcg by mouth daily.      . metoprolol (LOPRESSOR) 100 MG tablet take 1.5  tablets in the AM and 1 tablet at night 75 tablet 6  . rosuvastatin (CRESTOR) 20 MG tablet Take 10 mg by mouth daily.     Marland Kitchen triamterene-hydrochlorothiazide (MAXZIDE-25) 37.5-25 MG tablet Take 1 tablet by mouth every other day.    . valsartan (DIOVAN) 160 MG tablet Take 1 tablet (160 mg total) by mouth daily. 30 tablet 6   Current Facility-Administered Medications  Medication Dose Route Frequency Provider Last Rate Last Dose  . 0.9 %  sodium chloride infusion  500 mL Intravenous Continuous Lafayette Dragon, MD        Social History   Social History  . Marital Status: Married    Spouse Name: N/A  . Number of Children: N/A  . Years of Education: N/A   Occupational History  . Not on file.   Social History Main Topics  . Smoking status: Never Smoker   . Smokeless tobacco: Never Used  . Alcohol Use: No  . Drug Use: No  . Sexual Activity:  Not on file   Other Topics Concern  . Not on file   Social History Narrative   Additional social history is notable in that she is married for 48 years.  She does not have any children.  She is retired.  She completed 12 grade.  He watches a 57-year-old 3 days per week.  There is no history of tobacco use or alcohol use.  She does not exercise.  Family History  Problem Relation Age of Onset  . Colon cancer Sister   . Colon cancer Sister    Additional family history is notable in that her mother died at age 26.  Her father died at age 7 and had several strokes.  She has a sister who died at age 72 and also had a stroke.  ROS General: Negative; No fevers, chills, or night sweats HEENT: Negative; No changes in vision or hearing, sinus congestion, difficulty swallowing Pulmonary: Negative; No cough, wheezing, shortness of breath, hemoptysis Cardiovascular:  See HPI; positive for palpitations and bilateral ankle swelling; no chest pain, or presyncope. GI: Negative; No nausea, vomiting, diarrhea, or abdominal pain GU: Negative; No dysuria, hematuria, or difficulty voiding Musculoskeletal: Positive for arthritis of her back. Hematologic/Oncologic: Negative; no easy bruising, bleeding Endocrine: Positive for hypothyroidism no diabetes Neuro: Negative; no changes in balance, headaches Skin: Negative; No rashes or skin lesions Psychiatric: Negative; No behavioral problems, depression Sleep: Negative; No daytime sleepiness, hypersomnolence, bruxism, restless legs, hypnogagnic hallucinations Other comprehensive 14 point system review is negative   Physical Exam BP 180/96 mmHg  Pulse 89  Ht 5' 7"  (1.702 m)  Wt 162 lb 6.4 oz (73.664 kg)  BMI 25.43 kg/m2  Wt Readings from Last 3 Encounters:  01/23/16 162 lb 6.4 oz (73.664 kg)  01/10/16 161 lb (73.029 kg)  11/27/10 158 lb (71.668 kg)   General: Alert, oriented, no distress.  Skin: normal turgor, no rashes, warm and dry HEENT:  Normocephalic, atraumatic. Pupils equal round and reactive to light; sclera anicteric; extraocular muscles intact; Fundi; arteriolar narrowing; no hemorrhages or exudates.  Disks flat Nose without nasal septal hypertrophy Mouth/Parynx benign; Mallinpatti scale 2 Neck: No JVD, no carotid bruits; normal carotid upstroke Lungs: clear to ausculatation and percussion; no wheezing or rales Chest wall: without tenderness to palpitation Heart: PMI not displaced, RRR, s1 s2 normal, 1/6 systolic murmur, no diastolic murmur, no rubs, gallops, thrills, or heaves Abdomen: soft, nontender; no hepatosplenomehaly, BS+; abdominal aorta nontender and not dilated by palpation. Back:  no CVA tenderness Pulses 2+ Musculoskeletal: full range of motion, normal strength, no joint deformities Extremities: Trivial ankle edema ; no clubbing cyanosis, Homan's sign negative  Neurologic: grossly nonfocal; Cranial nerves grossly wnl Psychologic: Normal mood and affect   ECG (independently read by me): Normal sinus rhythm at 89 bpm.  Right bundle-branch block with repolarization changes.  LABS:  BMP Latest Ref Rng 01/10/2016 07/01/2008 06/30/2008  Glucose 65 - 99 mg/dL 194(H) 103(H) 155(H)  BUN 6 - 20 mg/dL 22(H) 17 16  Creatinine 0.44 - 1.00 mg/dL 1.08(H) 0.82 0.84  Sodium 135 - 145 mmol/L 137 140 139  Potassium 3.5 - 5.1 mmol/L 4.2 3.6 4.1  Chloride 101 - 111 mmol/L 102 103 106  CO2 22 - 32 mmol/L 21(L) 25 23  Calcium 8.9 - 10.3 mg/dL 9.7 9.0 9.6     Hepatic Function 06/30/2008  Total Protein 7.4  Albumin 4.0  AST 32  ALT 33  Alk Phosphatase 91  Total Bilirubin 0.7    CBC Latest Ref Rng 01/10/2016 07/01/2008 06/30/2008  WBC 4.0 - 10.5 K/uL 11.3(H) 8.9 14.4(H)  Hemoglobin 12.0 - 15.0 g/dL 11.6(L) 12.7 14.4  Hematocrit 36.0 - 46.0 % 35.4(L) 38.1 42.9  Platelets 150 - 400 K/uL 267 247 295   Lab Results  Component Value Date   MCV 89.4 01/10/2016   MCV 90.0 07/01/2008   MCV 90.2 06/30/2008   Lab  Results  Component Value Date   TSH 0.937 01/10/2016   Lab Results  Component Value Date   HGBA1C  07/01/2008    5.7 (NOTE)   The ADA recommends the following therapeutic goal for glycemic   control related to Hgb A1C measurement:   Goal of Therapy:   < 7.0% Hgb A1C   Reference: American Diabetes Association: Clinical Practice   Recommendations 2008, Diabetes Care,  2008, 31:(Suppl 1).     BNP No results found for: BNP  ProBNP    Component Value Date/Time   PROBNP <30.0 07/01/2008 0612     Lipid Panel     Component Value Date/Time   CHOL  07/01/2008 0612    128        ATP III CLASSIFICATION:  <200     mg/dL   Desirable  200-239  mg/dL   Borderline High  >=240    mg/dL   High   TRIG 93 07/01/2008 0612   HDL 25* 07/01/2008 0612   CHOLHDL 5.1 07/01/2008 0612   VLDL 19 07/01/2008 0612   LDLCALC  07/01/2008 0612    84        Total Cholesterol/HDL:CHD Risk Coronary Heart Disease Risk Table                     Men   Women  1/2 Average Risk   3.4   3.3    RADIOLOGY: No results found.   ASSESSMENT AND PLAN: Misty Newman is a 70 year old female who has a history of hypertension and palpitations.  She has been on metoprolol 100 mg twice a day, Maxide every other day, and remotely had also been started on Lanoxin 0.125 mg, which he has been taking for many years.  I do not know the specifics of her prior evaluations many years ago with Dr. Rollene Fare.  She recently had noticed nocturnal palpitations after she was awakened by her husband.  Her resting pulse is 89 bpm. .  On exam today, she was significantly hypertensive.  I am recommending that she is slightly  titrate her metoprolol to either 125 mg twice a day, or if she is unable to cut this 100 mg pill in quarter tablet.  She can take 150 g in the morning and 100 mg at night.  I'm also starting her on valsartan 160 mg daily.  I reviewed her recent blood work from her emergency room evaluation.  Her TSH was normal at 0.97.   Her digoxin level was 0.7.  Troponins were negative.  Serum creatinine was 1.08.  She was mildly anemic.  I am scheduling her for an echo Doppler study to evaluate for hypertensive heart disease and valvular architecture.  I suggested she try to reduce her diclofenac and take only as needed since this may also be contributing to blood pressure elevation and edema.  I will see her in 6 weeks for reevaluation.   Troy Sine, MD, [facility] 01/27/2016 2:18 PM

## 2016-02-12 ENCOUNTER — Other Ambulatory Visit: Payer: Self-pay

## 2016-02-12 ENCOUNTER — Ambulatory Visit (HOSPITAL_COMMUNITY): Payer: Medicare Other | Attending: Internal Medicine

## 2016-02-12 DIAGNOSIS — I340 Nonrheumatic mitral (valve) insufficiency: Secondary | ICD-10-CM | POA: Insufficient documentation

## 2016-02-12 DIAGNOSIS — I371 Nonrheumatic pulmonary valve insufficiency: Secondary | ICD-10-CM | POA: Diagnosis not present

## 2016-02-12 DIAGNOSIS — I10 Essential (primary) hypertension: Secondary | ICD-10-CM | POA: Diagnosis not present

## 2016-02-12 DIAGNOSIS — E785 Hyperlipidemia, unspecified: Secondary | ICD-10-CM | POA: Diagnosis not present

## 2016-02-12 DIAGNOSIS — I071 Rheumatic tricuspid insufficiency: Secondary | ICD-10-CM | POA: Insufficient documentation

## 2016-02-12 LAB — ECHOCARDIOGRAM COMPLETE
Ao-asc: 33 cm
E decel time: 218 msec
E/e' ratio: 17.5
FS: 39 % (ref 28–44)
IVS/LV PW RATIO, ED: 1.34
LA ID, A-P, ES: 40 mm
LA diam end sys: 40 mm
LA diam index: 2.13 cm/m2
LA vol A4C: 42 ml
LA vol index: 22.4 mL/m2
LA vol: 42 mL
LV E/e' medial: 17.5
LV E/e'average: 17.5
LV PW d: 7.77 mm — AB (ref 0.6–1.1)
LV e' LATERAL: 7.6 cm/s
LVOT SV: 75 mL
LVOT VTI: 26.4 cm
LVOT area: 2.84 cm2
LVOT diameter: 19 mm
LVOT peak grad rest: 7 mmHg
LVOT peak vel: 132 cm/s
Lateral S' vel: 12.7 cm/s
MV Dec: 218
MV Peak grad: 7 mmHg
MV pk A vel: 120 m/s
MV pk E vel: 133 m/s
RV sys press: 20 mmHg
Reg peak vel: 206 cm/s
S' Lateral: 12.7 cm/s
TDI e' lateral: 7.6
TDI e' medial: 6.34
TR max vel: 206 cm/s

## 2016-02-13 ENCOUNTER — Encounter: Payer: Self-pay | Admitting: *Deleted

## 2016-02-14 ENCOUNTER — Other Ambulatory Visit: Payer: Self-pay | Admitting: Endocrinology

## 2016-02-14 DIAGNOSIS — Z1231 Encounter for screening mammogram for malignant neoplasm of breast: Secondary | ICD-10-CM

## 2016-02-25 ENCOUNTER — Encounter: Payer: Medicare Other | Admitting: Gastroenterology

## 2016-03-03 ENCOUNTER — Ambulatory Visit
Admission: RE | Admit: 2016-03-03 | Discharge: 2016-03-03 | Disposition: A | Discharge status: Home / Self Care | Payer: Medicare Other | Source: Ambulatory Visit | Attending: Endocrinology | Admitting: Endocrinology

## 2016-03-03 DIAGNOSIS — Z1231 Encounter for screening mammogram for malignant neoplasm of breast: Secondary | ICD-10-CM

## 2016-03-28 ENCOUNTER — Encounter: Payer: Self-pay | Admitting: Gastroenterology

## 2016-04-01 ENCOUNTER — Ambulatory Visit: Payer: Medicare Other

## 2016-04-01 VITALS — Ht 67.0 in | Wt 163.2 lb

## 2016-04-01 DIAGNOSIS — Z800 Family history of malignant neoplasm of digestive organs: Secondary | ICD-10-CM

## 2016-04-01 NOTE — Progress Notes (Signed)
Pt came into the office today for her pre-visit prior to her colonoscopy with Dr Loletha Carrow on 04/07/16.Pt was seen in the ER 2 months ago due to irregular HR and uncontrolled B/P.Pt states she was put on Losartan ,but stopped it with out talking to the doctor.She has a history of very high B/P.The pt was informed we would need cardiac clearance prior to her colonoscopy.Pt has an appt with Dr Shelva Majestic on 04/28/16.Will cancel the colon on 04/07/16 and reschedule at a later time once we receive cardiac clearance.Pt understood!

## 2016-04-07 ENCOUNTER — Encounter: Payer: Medicare Other | Admitting: Gastroenterology

## 2016-04-22 ENCOUNTER — Telehealth: Payer: Self-pay | Admitting: Cardiovascular Disease

## 2016-04-28 ENCOUNTER — Ambulatory Visit: Payer: Medicare Other | Admitting: Cardiovascular Disease

## 2016-04-28 NOTE — Telephone Encounter (Signed)
Closed Encounter  °

## 2016-04-29 ENCOUNTER — Encounter (INDEPENDENT_AMBULATORY_CARE_PROVIDER_SITE_OTHER): Payer: Self-pay

## 2016-04-29 ENCOUNTER — Encounter: Payer: Self-pay | Admitting: Nurse Practitioner

## 2016-04-29 ENCOUNTER — Ambulatory Visit (INDEPENDENT_AMBULATORY_CARE_PROVIDER_SITE_OTHER): Payer: Medicare Other | Admitting: Nurse Practitioner

## 2016-04-29 VITALS — BP 159/83 | HR 72 | Ht 67.0 in | Wt 159.0 lb

## 2016-04-29 DIAGNOSIS — I10 Essential (primary) hypertension: Secondary | ICD-10-CM | POA: Diagnosis not present

## 2016-04-29 DIAGNOSIS — R002 Palpitations: Secondary | ICD-10-CM | POA: Insufficient documentation

## 2016-04-29 NOTE — Patient Instructions (Signed)
Medication Instructions:  Your physician recommends that you continue on your current medications as directed. Please refer to the Current Medication list given to you today.  Labwork: None Ordered  Testing/Procedures: None Ordered  Follow-Up: Your physician wants you to follow-up in: 1 YEAR with DR KELLY. You will receive a reminder letter in the mail two months in advance. If you don't receive a letter, please call our office to schedule the follow-up appointment.  Any Other Special Instructions Will Be Listed Below (If Applicable).     If you need a refill on your cardiac medications before your next appointment, please call your pharmacy.

## 2016-04-29 NOTE — Progress Notes (Signed)
Office Visit    Patient Name: Misty Newman Date of Encounter: 04/29/2016  Primary Care Provider:  Dwan Bolt, MD Primary Cardiologist:  Corky Downs, MD   Chief Complaint    70 year old female with a history of palpitations, hypertension, hyperlipidemia, and hypothyroidism, who presents for follow-up.  Past Medical History    Past Medical History:  Diagnosis Date  . Allergy    seasonal  . Arthritis   . Essential hypertension   . Heart murmur    a. 01/2016 Echo: EF 65-70%, no rwma, Gr1 DD, mild MR/TR, mild to mod PR, PASP 48mmHg.  Marland Kitchen History of chronic back pain   . Hypothyroidism   . Palpitations    Past Surgical History:  Procedure Laterality Date  . ABDOMINAL HYSTERECTOMY    . ANTERIOR FUSION CERVICAL SPINE     3 cervical fusions  . APPENDECTOMY    . CHOLECYSTECTOMY    . COLONOSCOPY    . TONSILLECTOMY AND ADENOIDECTOMY      Allergies  Allergies  Allergen Reactions  . Butorphanol Tartrate     REACTION: sweating, increased heartrate  . Cardizem [Diltiazem Hcl] Other (See Comments)    Can't remember  . Clarithromycin     Can't remember  . Meperidine Hcl     REACTION: itching    . Naloxone     Pt questions allergies, is unaware of ever having, does not sound familiar  . Penicillins     REACTION: unspecified  . Sulfonamide Derivatives     REACTION: does not remember    History of Present Illness    70 year old female with prior history of hypertension, hyperlipidemia, hypothyroidism, mitral valve prolapse, and palpitations. She was previously followed by Dr. Rollene Fare and has been on metoprolol and digoxin therapy for some time related to palpitations. She was evaluated by Dr. Claiborne Billings in late May in order to reestablish care after experiencing palpitations one night, prompting her to present to the ED. She was hypertensive during that office visit and valsartan therapy was added to her regimen. She was also advised to increase her beta blocker to 150  mg in the morning and 100 in the afternoon, secondary to palpitations. Echocardiogram was performed showed normal LV function and only mild MR and TR with mild to moderate pulmonic regurgitation.  Since her last visit she has done well. She took the higher dose of metoprolol and also the valsartan on one occasion, and her pressure dropped into the 90s. She felt lightheaded and dizzy that entire day. She never took another dose of valsartan. She has also since reduced her metoprolol back to 100 mg twice a day. She checks her blood pressure daily if not twice a day, and has her recordings with her today. All of her recordings are in the 1 teens to 130 range. She has had the occasional, brief palpitation but nothing sustained or particularly bothersome. She denies chest pain, dyspnea, PND, orthopnea, dizziness, syncope, edema, or early satiety.  Home Medications    Prior to Admission medications   Medication Sig Start Date End Date Taking? Authorizing Provider  aspirin 81 MG tablet Take 81 mg by mouth daily.     Yes Historical Provider, MD  cyclobenzaprine (FLEXERIL) 10 MG tablet Take 10 mg by mouth daily.     Yes Historical Provider, MD  diclofenac (VOLTAREN) 75 MG EC tablet Take 75 mg by mouth daily.     Yes Historical Provider, MD  fish oil-omega-3 fatty acids 1000 MG capsule Take  1 g by mouth daily.    Yes Historical Provider, MD  glucosamine-chondroitin 500-400 MG tablet Take 1 tablet by mouth 2 (two) times daily.     Yes Historical Provider, MD  LANOXIN 125 MCG tablet TAKE ONE TABLET BY MOUTH ONE TIME DAILY 11/02/13  Yes Lorretta Harp, MD  levothyroxine (SYNTHROID, LEVOTHROID) 100 MCG tablet Take 100 mcg by mouth daily.     Yes Historical Provider, MD  metoprolol (LOPRESSOR) 100 MG tablet take 1.5 tablets in the AM and 1 tablet at night 01/23/16  Yes Troy Sine, MD  rosuvastatin (CRESTOR) 20 MG tablet Take 10 mg by mouth daily.    Yes Historical Provider, MD  triamterene-hydrochlorothiazide  (MAXZIDE-25) 37.5-25 MG tablet Take 1 tablet by mouth every other day.   Yes Historical Provider, MD  valsartan (DIOVAN) 160 MG tablet Take 1 tablet (160 mg total) by mouth daily. 01/23/16  Yes Troy Sine, MD    Review of Systems    As above, she has been doing well. She has an occasional, brief palpitation but otherwise denies chest pain, dyspnea, PND, orthopnea, dizziness, syncope, edema, or early satiety.  All other systems reviewed and are otherwise negative except as noted above.  Physical Exam    VS:  BP (!) 159/83   Pulse 72   Ht 5\' 7"  (1.702 m)   Wt 159 lb (72.1 kg)   BMI 24.90 kg/m  , BMI Body mass index is 24.9 kg/m. GEN: Well nourished, well developed, in no acute distress.  HEENT: normal.  Neck: Supple, no JVD, carotid bruits, or masses. Cardiac: RRR, no murmurs, rubs, or gallops. No clubbing, cyanosis, edema.  Radials/DP/PT 2+ and equal bilaterally.  Respiratory:  Respirations regular and unlabored, clear to auscultation bilaterally. GI: Soft, nontender, nondistended, BS + x 4. MS: no deformity or atrophy. Skin: warm and dry, no rash. Neuro:  Strength and sensation are intact. Psych: Normal affect.  Accessory Clinical Findings    Echocardiogram reviewed with patient in detail.  Assessment & Plan    1.  Essential hypertension/white coat hypertension: Patient presents today for reevaluation. Upon her last visit in May, she was hypertensive and her beta blocker was to be increased to 150 mg in the morning and 100 mg in the afternoon. In addition, valsartan 160 mg daily was added. She only ever took that combination once and pressures were in the 90s and she felt poorly. Since then, she has only been taking metoprolol 100 mg twice a day and in that setting, pressures at home have been in the 1 teens to 1:30. She has felt well. Her pressure is elevated today in the office but was normal at home this morning. In light of these findings, I will not change any of her  medications and in fact, she would not be willing to take any additional antihypertensives anyway.  2. Palpitations: These have been stable. She is taking metoprolol 100 mg twice a day. We did discuss taking more, as previously recommended to prophylactically prevent palpitations, however she would prefer to stick with 100 twice a day and take a half of metoprolol if she would develop more frequent or persistent palpitations. She is also on digoxin, which she has been on long-term. She is taking the name brand and wonders if she should try the generic again. When she tried it in the past, she did not feel that particularly worked but at the same time, she was having issues with her thyroid. I advised that  it would be okay from our perspective to try the generic digoxin again, as it sounds like it is something in the area of $86 in savings. She will think about it.  3. Valvular heart disease: Recent echo showed only mild MR, mild TR, and mild to moderate pulmonic regurgitation. She is asymptomatic.  4. Preprocedure evaluation: Patient is pending colonoscopy and was told she needs cardiac clearance. She has been doing well and is okay to proceed with colonoscopy as scheduled without any additional ischemic testing.   5. Disposition: Patient will continue to follow her blood pressures at home and contact us if she is experiencing blood pressures consistently over [id number] systolic. Otherwise, follow-up in one year.   Murray Hodgkins, NP 04/29/2016, 11:53 AM

## 2016-05-06 DIAGNOSIS — L820 Inflamed seborrheic keratosis: Secondary | ICD-10-CM | POA: Diagnosis not present

## 2016-05-06 DIAGNOSIS — L821 Other seborrheic keratosis: Secondary | ICD-10-CM | POA: Diagnosis not present

## 2016-05-20 ENCOUNTER — Ambulatory Visit: Payer: Medicare Other | Admitting: Nurse Practitioner

## 2016-06-04 ENCOUNTER — Encounter: Payer: Self-pay | Admitting: Gastroenterology

## 2016-06-09 DIAGNOSIS — Z23 Encounter for immunization: Secondary | ICD-10-CM | POA: Diagnosis not present

## 2016-07-28 ENCOUNTER — Ambulatory Visit (AMBULATORY_SURGERY_CENTER): Payer: Self-pay | Admitting: *Deleted

## 2016-07-28 VITALS — Ht 67.0 in | Wt 161.0 lb

## 2016-07-28 DIAGNOSIS — Z800 Family history of malignant neoplasm of digestive organs: Secondary | ICD-10-CM

## 2016-07-28 MED ORDER — NA SULFATE-K SULFATE-MG SULF 17.5-3.13-1.6 GM/177ML PO SOLN: 1.0000 | Freq: Once | ORAL | 0 refills | Status: AC

## 2016-07-28 NOTE — Progress Notes (Signed)
No egg or soy allergy. No anesthesia problems.  No home O2.  No diet meds.  

## 2016-07-29 ENCOUNTER — Encounter: Payer: Self-pay | Admitting: Gastroenterology

## 2016-08-11 ENCOUNTER — Encounter: Payer: Self-pay | Admitting: Gastroenterology

## 2016-08-11 ENCOUNTER — Ambulatory Visit (AMBULATORY_SURGERY_CENTER): Payer: Medicare Other | Admitting: Gastroenterology

## 2016-08-11 VITALS — BP 151/64 | HR 72 | Temp 97.8°F | Resp 11 | Ht 67.0 in | Wt 161.0 lb

## 2016-08-11 DIAGNOSIS — Z800 Family history of malignant neoplasm of digestive organs: Secondary | ICD-10-CM | POA: Diagnosis not present

## 2016-08-11 DIAGNOSIS — Z1211 Encounter for screening for malignant neoplasm of colon: Secondary | ICD-10-CM

## 2016-08-11 DIAGNOSIS — Z1212 Encounter for screening for malignant neoplasm of rectum: Secondary | ICD-10-CM

## 2016-08-11 MED ORDER — SODIUM CHLORIDE 0.9 % IV SOLN: 500.0000 mL | INTRAVENOUS | Status: DC

## 2016-08-11 NOTE — Progress Notes (Signed)
Report to PACU, RN, vss, BBS= Clear.  

## 2016-08-11 NOTE — Patient Instructions (Signed)
YOU HAD AN ENDOSCOPIC PROCEDURE TODAY AT THE [facility]:   Refer to the procedure report that was given to you for any specific questions about what was found during the examination.  If the procedure report does not answer your questions, please call your gastroenterologist to clarify.  If you requested that your care partner not be given the details of your procedure findings, then the procedure report has been included in a sealed envelope for you to review at your convenience later.  YOU SHOULD EXPECT: Some feelings of bloating in the abdomen. Passage of more gas than usual.  Walking can help get rid of the air that was put into your GI tract during the procedure and reduce the bloating. If you had a lower endoscopy (such as a colonoscopy or flexible sigmoidoscopy) you may notice spotting of blood in your stool or on the toilet paper. If you underwent a bowel prep for your procedure, you may not have a normal bowel movement for a few days.  Please Note:  You might notice some irritation and congestion in your nose or some drainage.  This is from the oxygen used during your procedure.  There is no need for concern and it should clear up in a day or so.  SYMPTOMS TO REPORT IMMEDIATELY:   Following lower endoscopy (colonoscopy or flexible sigmoidoscopy):  Excessive amounts of blood in the stool  Significant tenderness or worsening of abdominal pains  Swelling of the abdomen that is new, acute  Fever of 100F or higher   For urgent or emergent issues, a gastroenterologist can be reached at any hour by calling [phone].   DIET:  We do recommend a small meal at first, but then you may proceed to your regular diet.  Drink plenty of fluids but you should avoid alcoholic beverages for 24 hours. Try to increase the fiber in your diet, and drink plenty of water.  ACTIVITY:  You should plan to take it easy for the rest of today and you should NOT DRIVE or use heavy machinery until  tomorrow (because of the sedation medicines used during the test).    FOLLOW UP: Our staff will call the number listed on your records the next business day following your procedure to check on you and address any questions or concerns that you may have regarding the information given to you following your procedure. If we do not reach you, we will leave a message.  However, if you are feeling well and you are not experiencing any problems, there is no need to return our call.  We will assume that you have returned to your regular daily activities without incident.  If any biopsies were taken you will be contacted by phone or by letter within the next 1-3 weeks.  Please call us at [phone] if you have not heard about the biopsies in 3 weeks.    SIGNATURES/CONFIDENTIALITY: You and/or your care partner have signed paperwork which will be entered into your electronic medical record.  These signatures attest to the fact that that the information above on your After Visit Summary has been reviewed and is understood.  Full responsibility of the confidentiality of this discharge information lies with you and/or your care-partner.  Read all of the handouts given to you by your recovery room nurse.  Thank-you for choosing us for your healthcare needs today. 

## 2016-08-11 NOTE — Op Note (Signed)
[facility] Patient Name: Misty Newman Procedure Date: 08/11/2016 11:07 AM MRN: [id number] Endoscopist: Mallie Mussel L. Loletha Carrow , MD Age: 70 Referring MD:  Date of Birth: 07/17/1946 Gender: Female Account #: [id number] Procedure:                Colonoscopy Indications:              Screening in patient at increased risk: Colorectal                            cancer in sister 57 or older Medicines:                Monitored Anesthesia Care Procedure:                Pre-Anesthesia Assessment:                           - Prior to the procedure, a History and Physical                            was performed, and patient medications and                            allergies were reviewed. The patient's tolerance of                            previous anesthesia was also reviewed. The risks                            and benefits of the procedure and the sedation                            options and risks were discussed with the patient.                            All questions were answered, and informed consent                            was obtained. Anticoagulants: The patient has taken                            aspirin. It was decided not to withhold this                            medication prior to the procedure. ASA Grade                            Assessment: II - A patient with mild systemic                            disease. After reviewing the risks and benefits,                            the patient was deemed in satisfactory condition to  undergo the procedure.                           After obtaining informed consent, the colonoscope                            was passed under direct vision. Throughout the                            procedure, the patient's blood pressure, pulse, and                            oxygen saturations were monitored continuously. The                            Model CF-HQ190L [id number]) scope was introduced                         through the anus and advanced to the the cecum,                            identified by appendiceal orifice and ileocecal                            valve. The colonoscopy was performed with                            difficulty due to multiple diverticula in the colon                            and a tortuous colon. Successful completion of the                            procedure was aided by withdrawing the scope and                            replacing with the pediatric colonoscope. The                            quality of the bowel preparation was excellent. The                            ileocecal valve, appendiceal orifice, and rectum                            were photographed. The bowel preparation used was                            SUPREP. Scope In: 11:21:20 AM Scope Out: 11:42:52 AM Scope Withdrawal Time: 0 hours 8 minutes 18 seconds  Total Procedure Duration: 0 hours 21 minutes 32 seconds  Findings:                 The perianal and digital rectal examinations were  normal.                           Multiple medium-mouthed diverticula were found from                            transverse colon to rectum.                           The exam was otherwise without abnormality on                            direct and retroflexion views. Complications:            No immediate complications. Estimated Blood Loss:     Estimated blood loss: none. Impression:               - Diverticulosis from transverse colon to rectum.                           - The examination was otherwise normal on direct                            and retroflexion views.                           - No specimens collected. Recommendation:           - Patient has a contact number available for                            emergencies. The signs and symptoms of potential                            delayed complications were discussed with the                             patient. Return to normal activities tomorrow.                            Written discharge instructions were provided to the                            patient.                           - Resume previous diet.                           - Continue present medications.                           - Repeat colonoscopy in 5 years for screening                            purposes. Francois Elk L. Loletha Carrow, MD 08/11/2016 11:48:37 AM This report has been signed electronically.

## 2016-08-12 ENCOUNTER — Telehealth: Payer: Self-pay | Admitting: *Deleted

## 2016-08-12 NOTE — Telephone Encounter (Signed)
  Follow up Call-  Call back number 08/11/2016  Post procedure Call Back phone  # [phone] hm  Permission to leave phone message Yes  Some recent data might be hidden     Patient questions:  Do you have a fever, pain , or abdominal swelling? No. Pain Score  0 *  Have you tolerated food without any problems? No.  Have you been able to return to your normal activities? No.  Do you have any questions about your discharge instructions: Diet   No. Medications  No. Follow up visit  No.  Do you have questions or concerns about your Care? No.  Actions: * If pain score is 4 or above: No action needed, pain <4.

## 2016-08-15 ENCOUNTER — Telehealth: Payer: Self-pay | Admitting: Gastroenterology

## 2016-08-15 NOTE — Telephone Encounter (Signed)
I anticipate her bowel habits will settle out to normal on their own. No further recommendations at this time.

## 2016-08-15 NOTE — Telephone Encounter (Signed)
Spoke to patient, had a colonoscopy on 12/11 and had not had a bm since then. She states that she took some Miralax last night to help with constipation. She then developed abdominal pain, denies vomiting, had diarrhea and temperature of 100.5. She tried to call the answering service last night, was on hold for an hour and then was cut off. They did not try calling back. She states she feels better today, ate some oatmeal. Denies fever or diarrhea this morning. Encouraged to eat a bland diet today and call our office if symptoms return.

## 2016-09-04 DIAGNOSIS — R05 Cough: Secondary | ICD-10-CM | POA: Diagnosis not present

## 2016-10-07 DIAGNOSIS — E789 Disorder of lipoprotein metabolism, unspecified: Secondary | ICD-10-CM | POA: Diagnosis not present

## 2016-10-07 DIAGNOSIS — E119 Type 2 diabetes mellitus without complications: Secondary | ICD-10-CM | POA: Diagnosis not present

## 2016-10-07 DIAGNOSIS — E039 Hypothyroidism, unspecified: Secondary | ICD-10-CM | POA: Diagnosis not present

## 2016-10-14 DIAGNOSIS — J329 Chronic sinusitis, unspecified: Secondary | ICD-10-CM | POA: Diagnosis not present

## 2017-02-17 DIAGNOSIS — E789 Disorder of lipoprotein metabolism, unspecified: Secondary | ICD-10-CM | POA: Diagnosis not present

## 2017-02-17 DIAGNOSIS — Z6824 Body mass index (BMI) 24.0-24.9, adult: Secondary | ICD-10-CM | POA: Diagnosis not present

## 2017-02-17 DIAGNOSIS — E119 Type 2 diabetes mellitus without complications: Secondary | ICD-10-CM | POA: Diagnosis not present

## 2017-02-17 DIAGNOSIS — Z01419 Encounter for gynecological examination (general) (routine) without abnormal findings: Secondary | ICD-10-CM | POA: Diagnosis not present

## 2017-02-24 DIAGNOSIS — I10 Essential (primary) hypertension: Secondary | ICD-10-CM | POA: Diagnosis not present

## 2017-02-24 DIAGNOSIS — E789 Disorder of lipoprotein metabolism, unspecified: Secondary | ICD-10-CM | POA: Diagnosis not present

## 2017-02-24 DIAGNOSIS — E032 Hypothyroidism due to medicaments and other exogenous substances: Secondary | ICD-10-CM | POA: Diagnosis not present

## 2017-03-27 ENCOUNTER — Other Ambulatory Visit: Payer: Self-pay | Admitting: Internal Medicine

## 2017-03-27 DIAGNOSIS — Z1231 Encounter for screening mammogram for malignant neoplasm of breast: Secondary | ICD-10-CM

## 2017-03-30 ENCOUNTER — Ambulatory Visit
Admission: RE | Admit: 2017-03-30 | Discharge: 2017-03-30 | Disposition: A | Discharge status: Home / Self Care | Payer: Medicare Other | Source: Ambulatory Visit | Attending: Internal Medicine | Admitting: Internal Medicine

## 2017-03-30 DIAGNOSIS — Z1231 Encounter for screening mammogram for malignant neoplasm of breast: Secondary | ICD-10-CM | POA: Diagnosis not present

## 2017-05-21 DIAGNOSIS — R601 Generalized edema: Secondary | ICD-10-CM | POA: Diagnosis not present

## 2017-05-21 DIAGNOSIS — M84371A Stress fracture, right ankle, initial encounter for fracture: Secondary | ICD-10-CM | POA: Diagnosis not present

## 2017-06-16 DIAGNOSIS — M79671 Pain in right foot: Secondary | ICD-10-CM | POA: Diagnosis not present

## 2017-06-16 DIAGNOSIS — M84371D Stress fracture, right ankle, subsequent encounter for fracture with routine healing: Secondary | ICD-10-CM | POA: Diagnosis not present

## 2017-07-14 DIAGNOSIS — M79671 Pain in right foot: Secondary | ICD-10-CM | POA: Diagnosis not present

## 2017-07-14 DIAGNOSIS — M84371D Stress fracture, right ankle, subsequent encounter for fracture with routine healing: Secondary | ICD-10-CM | POA: Diagnosis not present

## 2017-08-18 DIAGNOSIS — M84371D Stress fracture, right ankle, subsequent encounter for fracture with routine healing: Secondary | ICD-10-CM | POA: Diagnosis not present

## 2017-08-18 DIAGNOSIS — M79671 Pain in right foot: Secondary | ICD-10-CM | POA: Diagnosis not present

## 2017-10-06 DIAGNOSIS — E119 Type 2 diabetes mellitus without complications: Secondary | ICD-10-CM | POA: Diagnosis not present

## 2017-10-06 DIAGNOSIS — E039 Hypothyroidism, unspecified: Secondary | ICD-10-CM | POA: Diagnosis not present

## 2017-10-06 DIAGNOSIS — E789 Disorder of lipoprotein metabolism, unspecified: Secondary | ICD-10-CM | POA: Diagnosis not present

## 2017-11-17 DIAGNOSIS — M79671 Pain in right foot: Secondary | ICD-10-CM | POA: Diagnosis not present

## 2017-11-17 DIAGNOSIS — M84371D Stress fracture, right ankle, subsequent encounter for fracture with routine healing: Secondary | ICD-10-CM | POA: Diagnosis not present

## 2017-11-30 DIAGNOSIS — E789 Disorder of lipoprotein metabolism, unspecified: Secondary | ICD-10-CM | POA: Diagnosis not present

## 2017-11-30 DIAGNOSIS — E039 Hypothyroidism, unspecified: Secondary | ICD-10-CM | POA: Diagnosis not present

## 2017-11-30 DIAGNOSIS — E119 Type 2 diabetes mellitus without complications: Secondary | ICD-10-CM | POA: Diagnosis not present

## 2017-11-30 DIAGNOSIS — I10 Essential (primary) hypertension: Secondary | ICD-10-CM | POA: Diagnosis not present

## 2017-11-30 DIAGNOSIS — E118 Type 2 diabetes mellitus with unspecified complications: Secondary | ICD-10-CM | POA: Diagnosis not present

## 2018-01-06 ENCOUNTER — Ambulatory Visit (INDEPENDENT_AMBULATORY_CARE_PROVIDER_SITE_OTHER): Payer: Medicare Other

## 2018-01-06 ENCOUNTER — Telehealth (INDEPENDENT_AMBULATORY_CARE_PROVIDER_SITE_OTHER): Payer: Self-pay

## 2018-01-06 ENCOUNTER — Ambulatory Visit (INDEPENDENT_AMBULATORY_CARE_PROVIDER_SITE_OTHER): Payer: Medicare Other | Admitting: Orthopaedic Surgery

## 2018-01-06 ENCOUNTER — Encounter (INDEPENDENT_AMBULATORY_CARE_PROVIDER_SITE_OTHER): Payer: Self-pay | Admitting: Orthopaedic Surgery

## 2018-01-06 DIAGNOSIS — M25561 Pain in right knee: Secondary | ICD-10-CM

## 2018-01-06 DIAGNOSIS — M1711 Unilateral primary osteoarthritis, right knee: Secondary | ICD-10-CM | POA: Diagnosis not present

## 2018-01-06 MED ORDER — METHYLPREDNISOLONE ACETATE 40 MG/ML IJ SUSP
40.0000 mg | INTRAMUSCULAR | Status: AC | PRN
  Administered 2018-01-06: 40 mg via INTRA_ARTICULAR

## 2018-01-06 MED ORDER — LIDOCAINE HCL 1 % IJ SOLN
3.0000 mL | INTRAMUSCULAR | Status: AC | PRN
  Administered 2018-01-06: 3 mL

## 2018-01-06 NOTE — Telephone Encounter (Signed)
-----   Message from Marlyne Beards, [place] sent at 01/06/2018 10:09 AM EDT ----- Hyaluronic acid injection right knee in 1 month M17.11 - Dr. Ninfa Linden.  Had cortisone injection in that knee today (01/06/18)

## 2018-01-06 NOTE — Progress Notes (Signed)
Office Visit Note   [name]           Date of Birth: [birth year]           MRN: [id number] Visit Date: 01/06/2018              Requested by: Anda Kraft, MD [address] [place], [place] [address] PCP: Jani Gravel, MD   Assessment & Plan: Visit Diagnoses:  1. Right knee pain, unspecified chronicity   2. Unilateral primary osteoarthritis, right knee     Plan: The patient definitely has moderate tricompartment arthritis of her right knee and the pain associated with this.  We talked about activity modification as well as occasionally using cane in her opposite hand.  We talked about quad strengthening exercises well.  I do feel that she is a candidate for conservative treatment such as a steroid injection today and then a hyaluronic acid injection a month.  She also states conservative as possible as well.  I explained the risk and benefits of these types of injections to her and all questions and concerns were answered and addressed.  She tolerated steroid injection well.  We will see her back in a month to place hyaluronic acid injection into her right knee.  Follow-Up Instructions: Return in about 1 month (around 02/03/2018).   Orders:  Orders Placed This Encounter  Procedures  . Large Joint Inj  . XR Knee 1-2 Views Right   No orders of the defined types were placed in this encounter.     Procedures: Large Joint Inj on 01/06/2018 10:02 AM Indications: diagnostic evaluation and pain Details: 22 G 1.5 in needle, superolateral approach  Arthrogram: No  Medications: 3 mL lidocaine 1 %; 40 mg methylPREDNISolone acetate 40 MG/ML Outcome: tolerated well, no immediate complications Procedure, treatment alternatives, risks and benefits explained, specific risks discussed. Consent was given by the patient. Immediately prior to procedure a time out was called to verify the correct patient, procedure, equipment, support staff and site/side marked as required.  Patient was prepped and draped in the usual sterile fashion.       Clinical Data: No additional findings.   Subjective: Chief Complaint  Patient presents with  . Right Knee - Pain  The patient is a very pleasant 72 year old I am seeing for the first time today as a patient.  I have taken care of family members of her family.  She comes in today with right knee pain is been hurting off and on for a long time.  She says she can get on her knees well and she does have chronic back issues.  She does take diclofenac as an anti-inflammatory.  The knee sometimes catch on the right mainly just pain she first gets up in the morning and stiffness as well.  She is never had any other intervention to her knees or injuries that she is aware of.  She is never had any type of injections.  HPI  Review of Systems She currently denies any headache, chest pain, shortness of breath, fever, chills, nausea, vomiting.  Objective: Vital Signs: There were no vitals taken for this visit.  Physical Exam She is alert and oriented x3 and in no acute distress Ortho Exam Examination of her right knee does show some stiffness with flexion extension and is a slight effusion.  She does have significant medial joint line tenderness.  The range of motion is full.  Her knee is ligamentously stable. Specialty Comments:  No specialty comments available.  Imaging: Xr Knee 1-2 Views Right  Result Date: 01/06/2018 2 views of the right knee show moderate arthritic changes with medial joint space narrowing and slight varus malalignment.  There is significant patellofemoral disease and para-articular osteophytes throughout the knee.    PMFS History: Patient Active Problem List   Diagnosis Date Noted  . Unilateral primary osteoarthritis, right knee 01/06/2018  . Palpitations   . Essential hypertension 01/27/2016  . Hypothyroidism 01/27/2016  . Right bundle branch block 01/27/2016  . Hyperlipidemia LDL goal <70  01/27/2016  . Bilateral lower extremity edema 01/27/2016   Past Medical History:  Diagnosis Date  . Allergy    seasonal  . Anxiety   . Arthritis   . Cataract    removal bil  . Essential hypertension   . Heart murmur    a. 01/2016 Echo: EF 65-70%, no rwma, Gr1 DD, mild MR/TR, mild to mod PR, PASP 35mmHg.  Marland Kitchen History of chronic back pain   . Hyperlipidemia   . Hypothyroidism   . Osteoarthritis   . Palpitations     Family History  Problem Relation Age of Onset  . Colon polyps Sister   . Colon cancer Sister 68  . Esophageal cancer Neg Hx   . Pancreatic cancer Neg Hx   . Prostate cancer Neg Hx   . Rectal cancer Neg Hx   . Stomach cancer Neg Hx     Past Surgical History:  Procedure Laterality Date  . ABDOMINAL HYSTERECTOMY    . ANTERIOR FUSION CERVICAL SPINE     3 cervical fusions  . APPENDECTOMY    . BREAST EXCISIONAL BIOPSY Right    benign  . CHOLECYSTECTOMY    . COLONOSCOPY    . TONSILLECTOMY AND ADENOIDECTOMY     Social History   Occupational History  . Not on file  Tobacco Use  . Smoking status: Never Smoker  . Smokeless tobacco: Never Used  Substance and Sexual Activity  . Alcohol use: No  . Drug use: No  . Sexual activity: Not on file

## 2018-01-06 NOTE — Telephone Encounter (Signed)
Submitted application online for Monovisc injection, right knee. 

## 2018-02-03 ENCOUNTER — Telehealth (INDEPENDENT_AMBULATORY_CARE_PROVIDER_SITE_OTHER): Payer: Self-pay

## 2018-02-03 NOTE — Telephone Encounter (Signed)
Initiated PA for Monovisc, right knee ([id number]).  Per Wendelyn Breslow T. With [facility], authorization is pending, but it will be approved. Pending Auth.# is [id number]

## 2018-02-12 ENCOUNTER — Telehealth (INDEPENDENT_AMBULATORY_CARE_PROVIDER_SITE_OTHER): Payer: Self-pay

## 2018-02-12 NOTE — Telephone Encounter (Signed)
Patient approved to have Monovisc Inj., right knee. Covered and 80%, patient will be responsible for 20% OOP. Buy & Bill PA required-Approval#-[id number] per [facility]

## 2018-02-15 ENCOUNTER — Encounter (INDEPENDENT_AMBULATORY_CARE_PROVIDER_SITE_OTHER): Payer: Self-pay | Admitting: Orthopaedic Surgery

## 2018-02-15 ENCOUNTER — Ambulatory Visit (INDEPENDENT_AMBULATORY_CARE_PROVIDER_SITE_OTHER): Payer: Medicare Other | Admitting: Orthopaedic Surgery

## 2018-02-15 DIAGNOSIS — M1711 Unilateral primary osteoarthritis, right knee: Secondary | ICD-10-CM

## 2018-02-15 MED ORDER — HYALURONAN 88 MG/4ML IX SOSY
88.0000 mg | PREFILLED_SYRINGE | INTRA_ARTICULAR | Status: AC | PRN
  Administered 2018-02-15: 88 mg via INTRA_ARTICULAR

## 2018-02-15 MED ORDER — LIDOCAINE HCL 1 % IJ SOLN
3.0000 mL | INTRAMUSCULAR | Status: AC | PRN
  Administered 2018-02-15: 3 mL

## 2018-02-15 NOTE — Progress Notes (Signed)
   Procedure Note  [name]             Date of Birth: 12-Jul-1946           MRN: [id number]             Visit Date: 02/15/2018  Procedures: Visit Diagnoses: Unilateral primary osteoarthritis, right knee  Large Joint Inj: R knee on 02/15/2018 9:06 AM Indications: diagnostic evaluation and pain Details: 22 G 1.5 in needle, superolateral approach  Arthrogram: No  Medications: 3 mL lidocaine 1 %; 88 mg Hyaluronan 88 MG/4ML Outcome: tolerated well, no immediate complications Procedure, treatment alternatives, risks and benefits explained, specific risks discussed. Consent was given by the patient. Immediately prior to procedure a time out was called to verify the correct patient, procedure, equipment, support staff and site/side marked as required. Patient was prepped and draped in the usual sterile fashion.    The patient is here today for scheduled hyaluronic acid injection in the right knee with Monovisc.  She has pain in that knee due to moderate osteoarthritis.  She is tried and failed other conservative treatment measures including rest, ice, heat, anti-inflammatories and steroid injections.  This is the next step for her.  She does report global knee swelling and pain.  She is not interested in knee replacement surgery.  On examination of her right knee she does have global tenderness and a mild effusion with good range of motion.  She tolerated the Monovisc injection well.  All questions concerns were answered and addressed.  Follow-up will be as needed.

## 2018-03-01 DIAGNOSIS — E119 Type 2 diabetes mellitus without complications: Secondary | ICD-10-CM | POA: Diagnosis not present

## 2018-03-08 ENCOUNTER — Other Ambulatory Visit: Payer: Self-pay | Admitting: Internal Medicine

## 2018-03-08 DIAGNOSIS — Z1231 Encounter for screening mammogram for malignant neoplasm of breast: Secondary | ICD-10-CM

## 2018-04-06 ENCOUNTER — Ambulatory Visit
Admission: RE | Admit: 2018-04-06 | Discharge: 2018-04-06 | Disposition: A | Discharge status: Home / Self Care | Payer: Medicare Other | Source: Ambulatory Visit | Attending: Internal Medicine | Admitting: Internal Medicine

## 2018-04-06 DIAGNOSIS — Z1231 Encounter for screening mammogram for malignant neoplasm of breast: Secondary | ICD-10-CM

## 2018-04-28 ENCOUNTER — Encounter: Payer: Self-pay | Admitting: Physician Assistant

## 2018-04-28 ENCOUNTER — Ambulatory Visit: Payer: Medicare Other | Admitting: Physician Assistant

## 2018-04-28 VITALS — BP 188/88 | HR 80 | Ht 67.0 in | Wt 162.6 lb

## 2018-04-28 DIAGNOSIS — E039 Hypothyroidism, unspecified: Secondary | ICD-10-CM

## 2018-04-28 DIAGNOSIS — I341 Nonrheumatic mitral (valve) prolapse: Secondary | ICD-10-CM

## 2018-04-28 DIAGNOSIS — I10 Essential (primary) hypertension: Secondary | ICD-10-CM

## 2018-04-28 DIAGNOSIS — E785 Hyperlipidemia, unspecified: Secondary | ICD-10-CM

## 2018-04-28 DIAGNOSIS — R252 Cramp and spasm: Secondary | ICD-10-CM

## 2018-04-28 DIAGNOSIS — R002 Palpitations: Secondary | ICD-10-CM

## 2018-04-28 NOTE — Patient Instructions (Signed)
Medication Instructions:  Your physician recommends that you continue on your current medications as directed. Please refer to the Current Medication list given to you today.   Labwork: Bmet and Mag today  Testing/Procedures: None ordered  Follow-Up: Your physician recommends that you schedule a follow-up appointment in: 3-4 months with Dr.Kelly   Any Other Special Instructions Will Be Listed Below (If Applicable). Your physician has requested that you regularly monitor and record your blood pressure readings at home. Please use the same machine at the same time of day to check your readings and record them to bring to your follow-up visit.  Please bring your BP machine with you to your next appointment so that it can be checked for accuracy     If you need a refill on your cardiac medications before your next appointment, please call your pharmacy.

## 2018-04-28 NOTE — Progress Notes (Signed)
Cardiology Office Note    Date:  04/30/2018   ID:  Misty, Newman 07-May-1946, MRN [id number]  PCP:  Jani Gravel, MD  Cardiologist:  Dr. Claiborne Billings  Chief Complaint  Patient presents with  . Follow-up    seen for Dr. Claiborne Billings.     History of Present Illness:  Misty Newman is a 72 y.o. female with PMH of hypertension, hyperlipidemia, hypothyroidism, mitral valve prolapse and history of palpitation.  Misty Newman was a former patient of Dr. Rollene Fare and later followed by Dr. Claiborne Billings.  She previously was on metoprolol and digoxin therapy for palpitation.  Echocardiogram obtained on 02/12/2016 showed EF 65 to 70%, grade 1 DD, mild MR, mild to moderate PR.  Patient was last seen by Murray Hodgkins, NP on 04/29/2016, her palpitation symptoms to be very well controlled on metoprolol 100 mg twice daily.  Patient has been lost to follow-up since 2017, she returned today for to establish follow-up.  She denies any recent chest pain, orthopnea or PND.  She has trace amount of lower extremity edema which is not bad.  She occasionally feels some palpitation but only last a few seconds at a time.  Her palpitation symptoms to be fairly controlled on the digoxin and metoprolol.  She only takes her Maxide every other day due to concern of dehydration.  Otherwise her blood pressure today is 188/88. According to the patient, her blood pressure usually ranges in the 235T systolic at home.  She apparently had low blood pressure and dizziness when she was prescribed valsartan previously for high blood pressure in the office.  I will hold off on increasing her blood pressure medication.  She need to keep a blood pressure diary and bring her blood pressure cuff with her to the next office visit.  She can follow-up with Dr. Claiborne Billings in 3 to 4 months.  She does have some leg cramps, I will obtain a basic metabolic panel and magnesium.   Past Medical History:  Diagnosis Date  . Allergy    seasonal  . Anxiety   . Arthritis     . Cataract    removal bil  . Essential hypertension   . Heart murmur    a. 01/2016 Echo: EF 65-70%, no rwma, Gr1 DD, mild MR/TR, mild to mod PR, PASP 75mmHg.  Marland Kitchen History of chronic back pain   . Hyperlipidemia   . Hypothyroidism   . Osteoarthritis   . Palpitations     Past Surgical History:  Procedure Laterality Date  . ABDOMINAL HYSTERECTOMY    . ANTERIOR FUSION CERVICAL SPINE     3 cervical fusions  . APPENDECTOMY    . BREAST EXCISIONAL BIOPSY Right    benign  . CHOLECYSTECTOMY    . COLONOSCOPY    . TONSILLECTOMY AND ADENOIDECTOMY      Current Medications: Outpatient Medications Prior to Visit  Medication Sig Dispense Refill  . aspirin 81 MG tablet Take 81 mg by mouth daily.      . betamethasone valerate (VALISONE) 0.1 % cream betamethasone valerate 0.1 % topical cream    . cyclobenzaprine (FLEXERIL) 10 MG tablet Take 10 mg by mouth daily.      . diclofenac (VOLTAREN) 75 MG EC tablet Take 75 mg by mouth daily.      . Estradiol (YUVAFEM) 10 MCG TABS vaginal tablet Yuvafem 10 mcg vaginal tablet    . fish oil-omega-3 fatty acids 1000 MG capsule Take 1 g by mouth daily.     Marland Kitchen  glucosamine-chondroitin 500-400 MG tablet Take 1 tablet by mouth 2 (two) times daily.      Marland Kitchen LANOXIN 125 MCG tablet TAKE ONE TABLET BY MOUTH ONE TIME DAILY 15 tablet 0  . levothyroxine (SYNTHROID, LEVOTHROID) 100 MCG tablet Take 100 mcg by mouth daily.      . metoprolol tartrate (LOPRESSOR) 100 MG tablet Take 100 mg by mouth 2 (two) times daily.    . Misc Natural Products (OSTEO BI-FLEX/5-LOXIN ADVANCED PO) Take by mouth 2 (two) times daily.    . rosuvastatin (CRESTOR) 20 MG tablet Take 10 mg by mouth daily.     Marland Kitchen triamterene-hydrochlorothiazide (MAXZIDE-25) 37.5-25 MG tablet Take 1 tablet by mouth every other day.    . metoprolol (LOPRESSOR) 100 MG tablet take 1.5 tablets in the AM and 1 tablet at night 75 tablet 6   Facility-Administered Medications Prior to Visit  Medication Dose Route Frequency  Provider Last Rate Last Dose  . 0.9 %  sodium chloride infusion  500 mL Intravenous Continuous Lafayette Dragon, MD      . 0.9 %  sodium chloride infusion  500 mL Intravenous Continuous Danis, Estill Cotta III, MD         Allergies:   Butorphanol tartrate; Cardizem [diltiazem hcl]; Clarithromycin; Meperidine hcl; Naloxone; Penicillins; Sulfa antibiotics; and Sulfonamide derivatives   Social History   Socioeconomic History  . Marital status: Married    Spouse name: Not on file  . Number of children: Not on file  . Years of education: Not on file  . Highest education level: Not on file  Occupational History  . Not on file  Social Needs  . Financial resource strain: Not on file  . Food insecurity:    Worry: Not on file    Inability: Not on file  . Transportation needs:    Medical: Not on file    Non-medical: Not on file  Tobacco Use  . Smoking status: Never Smoker  . Smokeless tobacco: Never Used  Substance and Sexual Activity  . Alcohol use: No  . Drug use: No  . Sexual activity: Not on file  Lifestyle  . Physical activity:    Days per week: Not on file    Minutes per session: Not on file  . Stress: Not on file  Relationships  . Social connections:    Talks on phone: Not on file    Gets together: Not on file    Attends religious service: Not on file    Active member of club or organization: Not on file    Attends meetings of clubs or organizations: Not on file    Relationship status: Not on file  Other Topics Concern  . Not on file  Social History Narrative  . Not on file     Family History:  The patient's family history includes Colon cancer (age of onset: 39) in her sister; Colon polyps in her sister.   ROS:   Please see the history of present illness.    ROS All other systems reviewed and are negative.   PHYSICAL EXAM:   VS:  BP (!) 188/88   Pulse 80   Ht 5\' 7"  (1.702 m)   Wt 162 lb 9.6 oz (73.8 kg)   BMI 25.47 kg/m    GEN: Well nourished, well developed, in  no acute distress  HEENT: normal  Neck: no JVD, carotid bruits, or masses Cardiac: RRR; no murmurs, rubs, or gallops,no edema  Respiratory:  clear to auscultation bilaterally, normal  work of breathing GI: soft, nontender, nondistended, + BS MS: no deformity or atrophy  Skin: warm and dry, no rash Neuro:  Alert and Oriented x 3, Strength and sensation are intact Psych: euthymic mood, full affect  Wt Readings from Last 3 Encounters:  04/28/18 162 lb 9.6 oz (73.8 kg)  08/11/16 161 lb (73 kg)  07/28/16 161 lb (73 kg)      Studies/Labs Reviewed:   EKG:  EKG is ordered today.  The ekg ordered today demonstrates normal sinus rhythm, right bundle branch block  Recent Labs: 04/28/2018: BUN 14; Creatinine, Ser 0.82; Magnesium 1.8; Potassium 4.3; Sodium 140   Lipid Panel    Component Value Date/Time   CHOL  07/01/2008 0612    128        ATP III CLASSIFICATION:  <200     mg/dL   Desirable  200-239  mg/dL   Borderline High  >=240    mg/dL   High   TRIG 93 07/01/2008 0612   HDL 25 (L) 07/01/2008 0612   CHOLHDL 5.1 07/01/2008 0612   VLDL 19 07/01/2008 0612   LDLCALC  07/01/2008 0612    84        Total Cholesterol/HDL:CHD Risk Coronary Heart Disease Risk Table                     Men   Women  1/2 Average Risk   3.4   3.3    Additional studies/ records that were reviewed today include:   Echo 02/12/2016 LV EF: 65% -   70% Study Conclusions  - Left ventricle: The cavity size was normal. Wall thickness was   normal. Systolic function was vigorous. The estimated ejection   fraction was in the range of 65% to 70%. Wall motion was normal;   there were no regional wall motion abnormalities. Doppler   parameters are consistent with abnormal left ventricular   relaxation (grade 1 diastolic dysfunction). The E/e&' ratio is   >15, suggesting elevated LV filling pressure. - Mitral valve: Mildly thickened leaflets . There was mild   regurgitation. - Left atrium: The atrium was normal  in size. - Atrial septum: No defect or patent foramen ovale was identified. - Tricuspid valve: There was mild regurgitation. - Pulmonic valve: Poorly visualized. There was mild to moderate   regurgitation. - Pulmonary arteries: PA peak pressure: 20 mm Hg (S). - Inferior vena cava: The vessel was normal in size. The   respirophasic diameter changes were in the normal range (>= 50%),   consistent with normal central venous pressure.  Impressions:  - Compared to a prior study in 2015, the LVEF is higher at 65-70%.   There is mild MR, mild TR, and mild to moderate PR.    ASSESSMENT:    1. Essential hypertension   2. Leg cramp   3. Palpitation   4. Hyperlipidemia, unspecified hyperlipidemia type   5. Hypothyroidism, unspecified type   6. Mitral valve prolapse      PLAN:  In order of problems listed above:  1. Hypertension: Even though her blood pressure is elevated in the office, however normally her blood pressure is in the 120s at home.  She did not take her valsartan due to significant dizziness.  She is on the [facility] every other day to avoid dehydration.  I asked her to bring her blood pressure cuff and the blood pressure diary with her treating the next office visit.  I will hold off on increasing  her blood pressure medication  2. Leg cramps: Obtain basic metabolic panel and magnesium to rule out electrolyte imbalance.  Suspicion for lower extremity arterial disease very low  3. Palpitation: Well-controlled on the current dose of metoprolol  4. Hyperlipidemia: Continue Crestor 10 mg daily.  Cholesterol well controlled  5. Hypothyroidism: On Synthroid.  Managed by primary care provider  6. Mitral valve prolapse: Previous echocardiogram in 2017 did not mention mitral valve prolapse, MR was mild.    Medication Adjustments/Labs and Tests Ordered: Current medicines are reviewed at length with the patient today.  Concerns regarding medicines are outlined above.  Medication  changes, Labs and Tests ordered today are listed in the Patient Instructions below. Patient Instructions  Medication Instructions:  Your physician recommends that you continue on your current medications as directed. Please refer to the Current Medication list given to you today.   Labwork: Bmet and Mag today  Testing/Procedures: None ordered  Follow-Up: Your physician recommends that you schedule a follow-up appointment in: 3-4 months with Dr.Kelly   Any Other Special Instructions Will Be Listed Below (If Applicable). Your physician has requested that you regularly monitor and record your blood pressure readings at home. Please use the same machine at the same time of day to check your readings and record them to bring to your follow-up visit.  Please bring your BP machine with you to your next appointment so that it can be checked for accuracy     If you need a refill on your cardiac medications before your next appointment, please call your pharmacy.      Hilbert Corrigan, [place]  04/30/2018 11:35 PM    [facility] [place], [place], [place]  [address] Phone: [phone]; Fax: [phone]

## 2018-04-29 LAB — BASIC METABOLIC PANEL
BUN/Creatinine Ratio: 17 (ref 12–28)
BUN: 14 mg/dL (ref 8–27)
CO2: 22 mmol/L (ref 20–29)
Calcium: 9.8 mg/dL (ref 8.7–10.3)
Chloride: 101 mmol/L (ref 96–106)
Creatinine, Ser: 0.82 mg/dL (ref 0.57–1.00)
GFR calc Af Amer: 84 mL/min/{1.73_m2} (ref >59)
GFR calc non Af Amer: 73 mL/min/{1.73_m2} (ref >59)
Glucose: 162 mg/dL — ABNORMAL HIGH (ref 65–99)
Potassium: 4.3 mmol/L (ref 3.5–5.2)
Sodium: 140 mmol/L (ref 134–144)

## 2018-04-29 LAB — MAGNESIUM: Magnesium: 1.8 mg/dL (ref 1.6–2.3)

## 2018-04-30 ENCOUNTER — Encounter: Payer: Self-pay | Admitting: Physician Assistant

## 2018-05-31 DIAGNOSIS — E119 Type 2 diabetes mellitus without complications: Secondary | ICD-10-CM | POA: Diagnosis not present

## 2018-05-31 DIAGNOSIS — I10 Essential (primary) hypertension: Secondary | ICD-10-CM | POA: Diagnosis not present

## 2018-05-31 DIAGNOSIS — E039 Hypothyroidism, unspecified: Secondary | ICD-10-CM | POA: Diagnosis not present

## 2018-05-31 DIAGNOSIS — E789 Disorder of lipoprotein metabolism, unspecified: Secondary | ICD-10-CM | POA: Diagnosis not present

## 2018-06-07 DIAGNOSIS — I10 Essential (primary) hypertension: Secondary | ICD-10-CM | POA: Diagnosis not present

## 2018-06-07 DIAGNOSIS — E789 Disorder of lipoprotein metabolism, unspecified: Secondary | ICD-10-CM | POA: Diagnosis not present

## 2018-06-07 DIAGNOSIS — E039 Hypothyroidism, unspecified: Secondary | ICD-10-CM | POA: Diagnosis not present

## 2018-06-07 DIAGNOSIS — E119 Type 2 diabetes mellitus without complications: Secondary | ICD-10-CM | POA: Diagnosis not present

## 2018-07-08 DIAGNOSIS — M79671 Pain in right foot: Secondary | ICD-10-CM | POA: Diagnosis not present

## 2018-07-08 DIAGNOSIS — M84371D Stress fracture, right ankle, subsequent encounter for fracture with routine healing: Secondary | ICD-10-CM | POA: Diagnosis not present

## 2018-08-19 ENCOUNTER — Ambulatory Visit: Payer: Medicare Other | Admitting: Cardiovascular Disease

## 2018-08-19 ENCOUNTER — Encounter (INDEPENDENT_AMBULATORY_CARE_PROVIDER_SITE_OTHER): Payer: Self-pay

## 2018-08-19 ENCOUNTER — Encounter: Payer: Self-pay | Admitting: Cardiovascular Disease

## 2018-08-19 VITALS — BP 183/88 | HR 90 | Ht 67.0 in | Wt 161.4 lb

## 2018-08-19 DIAGNOSIS — I341 Nonrheumatic mitral (valve) prolapse: Secondary | ICD-10-CM

## 2018-08-19 DIAGNOSIS — R002 Palpitations: Secondary | ICD-10-CM | POA: Diagnosis not present

## 2018-08-19 DIAGNOSIS — E785 Hyperlipidemia, unspecified: Secondary | ICD-10-CM | POA: Diagnosis not present

## 2018-08-19 DIAGNOSIS — I4510 Unspecified right bundle-branch block: Secondary | ICD-10-CM

## 2018-08-19 DIAGNOSIS — I10 Essential (primary) hypertension: Secondary | ICD-10-CM

## 2018-08-19 DIAGNOSIS — E039 Hypothyroidism, unspecified: Secondary | ICD-10-CM

## 2018-08-19 MED ORDER — RAMIPRIL 10 MG PO CAPS: 10.0000 mg | ORAL_CAPSULE | Freq: Every day | ORAL | 3 refills | Status: DC

## 2018-08-19 MED ORDER — DILTIAZEM HCL ER COATED BEADS 120 MG PO CP24: 120.0000 mg | ORAL_CAPSULE | Freq: Every day | ORAL | 2 refills | Status: DC

## 2018-08-19 NOTE — Patient Instructions (Addendum)
Medication Instructions:  Stop Digoxin Start Ramipril 10 mg daily Start Cardizem CD 120 mg at bedtime. If you need a refill on your cardiac medications before your next appointment, please call your pharmacy.   Testing: Echocardiogram - Your physician has requested that you have an echocardiogram. Echocardiography is a painless test that uses sound waves to create images of your heart. It provides your doctor with information about the size and shape of your heart and how well your heart's chambers and valves are working. This procedure takes approximately one hour. There are no restrictions for this procedure. This will be performed at our [facility] location - [address], [address].   Follow-Up: At [facility], you and your health needs are our priority.  As part of our continuing mission to provide you with exceptional heart care, we have created designated Provider Care Teams.  These Care Teams include your primary Cardiologist (physician) and Advanced Practice Providers (APPs -  Physician Assistants and Nurse Practitioners) who all work together to provide you with the care you need, when you need it. . Follow up with Almyra Deforest, PA-C in 3 months. . Follow up with Dr.Kelly in 6 months.

## 2018-08-19 NOTE — Progress Notes (Signed)
Patient ID: Misty Newman, female   DOB: [birth year], 72 y.o.   MRN: [id number]     Primary MD: Dr. Gareth Eagle  PATIENT PROFILE: Misty Newman is a 72 y.o. female who remotely had seen Dr. Rollene Fare many years ago.  I last saw her in May 2017.  She was last seen in August 2019 by Almyra Deforest, PA.  She presents for follow-up evaluation  HPI:  Misty Newman has a history of hypertension, hyperlipidemia, thyroid disease, mitral valve prolapse, and palpitations.  Remotely, she had been on Ramipril for blood pressure control, but she states that this was discharged one year ago.  She also has been taking Maxide recently every other day.  She was recently evaluated in the emergency room on 01/10/2016 when after she was awakened by her husband from sleep.  She began to notice palpitations.  Her initial ECG had shown sinus tachycardia at 117 bpm with right bundle branch block and inferior T-wave abnormality.  She admits to chronic swelling in her ankles bilaterally.  She has issues with arthritis involving her back and has been taking Voltaren 75 mg daily.  She is a history of hypothyroidism and has been on thyroid replacement with levothyroxine 100 g.  I last saw her in May 2017 she was on  metoprolol 100 mg twice a day for her palpitations and a long time ago was also started on Lanoxin by Dr. Rollene Fare.  She has a history of hyperlipidemia was on Crestor 10 mg.    In 2017, she was given a trial of valsartan but apparently her blood pressure became low and this was discontinued.  When she saw Almyra Deforest, [facility] in August 2019, she had blood pressure lability.  He recommended that she obtain daily monitor of her blood pressures.  She does have blood pressure lability and at times her blood pressures can be in the 180s but she states at home most of the time her blood pressures in the 659D with diastolics in the 35T.  Apparently she has been taking metoprolol tartrate 100 mg twice a day, Lanoxin 0.125 mg daily,  triamterene HCT 37.5/25 mg every other day and although it was not on her medical record she has been taking ramipril 10 mg twice a day.  She has had issues over the past year with diverticulitis.  Recently she had developed a tooth abscess.  She underwent a root canal and also has required a dental extraction.  She denies chest pain PND orthopnea.  She presents for evaluation.  Past Medical History:  Diagnosis Date  . Allergy    seasonal  . Anxiety   . Arthritis   . Cataract    removal bil  . Essential hypertension   . Heart murmur    a. 01/2016 Echo: EF 65-70%, no rwma, Gr1 DD, mild MR/TR, mild to mod PR, PASP 86mHg.  .Marland KitchenHistory of chronic back pain   . Hyperlipidemia   . Hypothyroidism   . Osteoarthritis   . Palpitations     Past Surgical History:  Procedure Laterality Date  . ABDOMINAL HYSTERECTOMY    . ANTERIOR FUSION CERVICAL SPINE     3 cervical fusions  . APPENDECTOMY    . BREAST EXCISIONAL BIOPSY Right    benign  . CHOLECYSTECTOMY    . COLONOSCOPY    . TONSILLECTOMY AND ADENOIDECTOMY      Allergies  Allergen Reactions  . Butorphanol Tartrate     REACTION: sweating, increased heartrate  .  Cardizem [Diltiazem Hcl] Other (See Comments)    Per pt cause b/p to drop low  . Clarithromycin     Can't remember  . Meperidine Hcl     REACTION: itching    . Naloxone     Pt questions allergies, is unaware of ever having, does not sound familiar  . Penicillins     REACTION: unspecified- as a child  . Sulfa Antibiotics   . Sulfonamide Derivatives     REACTION: does not remember    Current Outpatient Medications  Medication Sig Dispense Refill  . aspirin 81 MG tablet Take 81 mg by mouth daily.      . betamethasone valerate (VALISONE) 0.1 % cream betamethasone valerate 0.1 % topical cream    . cyclobenzaprine (FLEXERIL) 10 MG tablet Take 10 mg by mouth daily.      . diclofenac (VOLTAREN) 75 MG EC tablet Take 75 mg by mouth daily.      . Estradiol (YUVAFEM) 10 MCG  TABS vaginal tablet Yuvafem 10 mcg vaginal tablet    . fish oil-omega-3 fatty acids 1000 MG capsule Take 1 g by mouth daily.     Marland Kitchen glucosamine-chondroitin 500-400 MG tablet Take 1 tablet by mouth 2 (two) times daily.      Marland Kitchen levothyroxine (SYNTHROID, LEVOTHROID) 100 MCG tablet Take 100 mcg by mouth daily.      . metoprolol tartrate (LOPRESSOR) 100 MG tablet Take 100 mg by mouth 2 (two) times daily.    . Misc Natural Products (OSTEO BI-FLEX/5-LOXIN ADVANCED PO) Take by mouth 2 (two) times daily.    . rosuvastatin (CRESTOR) 20 MG tablet Take 10 mg by mouth daily.     Marland Kitchen triamterene-hydrochlorothiazide (MAXZIDE-25) 37.5-25 MG tablet Take 1 tablet by mouth every other day.    . diltiazem (CARDIZEM CD) 120 MG 24 hr capsule Take 1 capsule (120 mg total) by mouth daily. 90 capsule 2  . ramipril (ALTACE) 10 MG capsule Take 1 capsule (10 mg total) by mouth daily. 90 capsule 3   Current Facility-Administered Medications  Medication Dose Route Frequency Provider Last Rate Last Dose  . 0.9 %  sodium chloride infusion  500 mL Intravenous Continuous Lafayette Dragon, MD      . 0.9 %  sodium chloride infusion  500 mL Intravenous Continuous Danis, Kirke Corin, MD        Social History   Socioeconomic History  . Marital status: Married    Spouse name: Not on file  . Number of children: Not on file  . Years of education: Not on file  . Highest education level: Not on file  Occupational History  . Not on file  Social Needs  . Financial resource strain: Not on file  . Food insecurity:    Worry: Not on file    Inability: Not on file  . Transportation needs:    Medical: Not on file    Non-medical: Not on file  Tobacco Use  . Smoking status: Never Smoker  . Smokeless tobacco: Never Used  Substance and Sexual Activity  . Alcohol use: No  . Drug use: No  . Sexual activity: Not on file  Lifestyle  . Physical activity:    Days per week: Not on file    Minutes per session: Not on file  . Stress: Not on  file  Relationships  . Social connections:    Talks on phone: Not on file    Gets together: Not on file    Attends  religious service: Not on file    Active member of club or organization: Not on file    Attends meetings of clubs or organizations: Not on file    Relationship status: Not on file  . Intimate partner violence:    Fear of current or ex partner: Not on file    Emotionally abused: Not on file    Physically abused: Not on file    Forced sexual activity: Not on file  Other Topics Concern  . Not on file  Social History Narrative  . Not on file   Additional social history is notable in that she is married for 50 years.  She does not have any children.  She is retired.  She completed 12 grade.  He watches a 28-year-old 3 days per week.  There is no history of tobacco use or alcohol use.  She does not exercise.  Family History  Problem Relation Age of Onset  . Colon polyps Sister   . Colon cancer Sister 26  . Esophageal cancer Neg Hx   . Pancreatic cancer Neg Hx   . Prostate cancer Neg Hx   . Rectal cancer Neg Hx   . Stomach cancer Neg Hx    Additional family history is notable in that her mother died at age 50.  Her father died at age 41 and had several strokes.  She has a sister who died at age 44 and also had a stroke.  ROS General: Negative; No fevers, chills, or night sweats HEENT: Negative; No changes in vision or hearing, sinus congestion, difficulty swallowing Pulmonary: Negative; No cough, wheezing, shortness of breath, hemoptysis Cardiovascular:  See HPI; positive for palpitations and bilateral ankle swelling; no chest pain, or presyncope. GI: Negative; No nausea, vomiting, diarrhea, or abdominal pain GU: Negative; No dysuria, hematuria, or difficulty voiding Musculoskeletal: Positive for arthritis of her back. Hematologic/Oncologic: Negative; no easy bruising, bleeding Endocrine: Positive for hypothyroidism no diabetes Neuro: Negative; no changes in balance,  headaches Skin: Negative; No rashes or skin lesions Psychiatric: Negative; No behavioral problems, depression Sleep: Negative; No daytime sleepiness, hypersomnolence, bruxism, restless legs, hypnogagnic hallucinations Other comprehensive 14 point system review is negative   Physical Exam BP (!) 183/88   Pulse 90   Ht 5' 7"  (1.702 m)   Wt 161 lb 6.4 oz (73.2 kg)   BMI 25.28 kg/m    Repeat blood pressure by me remained elevated at 180/86  Wt Readings from Last 3 Encounters:  08/19/18 161 lb 6.4 oz (73.2 kg)  04/28/18 162 lb 9.6 oz (73.8 kg)  08/11/16 161 lb (73 kg)   General: Alert, oriented, no distress.  Skin: normal turgor, no rashes, warm and dry HEENT: Normocephalic, atraumatic. Pupils equal round and reactive to light; sclera anicteric; extraocular muscles intact; Fundi  Nose without nasal septal hypertrophy Mouth/Parynx benign; Mallinpatti scale Neck: No JVD, no carotid bruits; normal carotid upstroke Lungs: clear to ausculatation and percussion; no wheezing or rales Chest wall: pectus; without tenderness to palpitation Heart: PMI not displaced, RRR, s1 s2 normal, 1/6 systolic murmur, no diastolic murmur, no rubs, gallops, thrills, or heaves Abdomen: soft, nontender; no hepatosplenomehaly, BS+; abdominal aorta nontender and not dilated by palpation. Back: no CVA tenderness Pulses 2+ Musculoskeletal: full range of motion, normal strength, no joint deformities Extremities: no clubbing cyanosis or edema, Homan's sign negative  Neurologic: grossly nonfocal; Cranial nerves grossly wnl Psychologic: Normal mood and affect   ECG (independently read by me): Normal sinus rhythm at 90 bpm.  Right bundle branch block with repolarization changes.  Borderline first-degree block with a PR interval of 2 8 ms.  QTc interval 481 ms.    May 2017 ECG (independently read by me): Normal sinus rhythm at 89 bpm.  Right bundle-branch block with repolarization changes.  LABS:  BMP Latest Ref  Rng & Units 04/28/2018 01/10/2016 07/01/2008  Glucose 65 - 99 mg/dL 162(H) 194(H) 103(H)  BUN 8 - 27 mg/dL 14 22(H) 17  Creatinine 0.57 - 1.00 mg/dL 0.82 1.08(H) 0.82  BUN/Creat Ratio 12 - 28 17 - -  Sodium 134 - 144 mmol/L 140 137 140  Potassium 3.5 - 5.2 mmol/L 4.3 4.2 3.6  Chloride 96 - 106 mmol/L 101 102 103  CO2 20 - 29 mmol/L 22 21(L) 25  Calcium 8.7 - 10.3 mg/dL 9.8 9.7 9.0     Hepatic Function 06/30/2008  Total Protein 7.4  Albumin 4.0  AST 32  ALT 33  Alk Phosphatase 91  Total Bilirubin 0.7    CBC Latest Ref Rng & Units 01/10/2016 07/01/2008 06/30/2008  WBC 4.0 - 10.5 K/uL 11.3(H) 8.9 14.4(H)  Hemoglobin 12.0 - 15.0 g/dL 11.6(L) 12.7 14.4  Hematocrit 36.0 - 46.0 % 35.4(L) 38.1 42.9  Platelets 150 - 400 K/uL 267 247 295   Lab Results  Component Value Date   MCV 89.4 01/10/2016   MCV 90.0 07/01/2008   MCV 90.2 06/30/2008   Lab Results  Component Value Date   TSH 0.937 01/10/2016   Lab Results  Component Value Date   HGBA1C  07/01/2008    5.7 (NOTE)   The ADA recommends the following therapeutic goal for glycemic   control related to Hgb A1C measurement:   Goal of Therapy:   < 7.0% Hgb A1C   Reference: American Diabetes Association: Clinical Practice   Recommendations 2008, Diabetes Care,  2008, 31:(Suppl 1).     BNP No results found for: BNP  ProBNP    Component Value Date/Time   PROBNP <30.0 07/01/2008 0612     Lipid Panel     Component Value Date/Time   CHOL  07/01/2008 0612    128        ATP III CLASSIFICATION:  <200     mg/dL   Desirable  200-239  mg/dL   Borderline High  >=240    mg/dL   High   TRIG 93 07/01/2008 0612   HDL 25 (L) 07/01/2008 0612   CHOLHDL 5.1 07/01/2008 0612   VLDL 19 07/01/2008 0612   LDLCALC  07/01/2008 0612    84        Total Cholesterol/HDL:CHD Risk Coronary Heart Disease Risk Table                     Men   Women  1/2 Average Risk   3.4   3.3    Recent laboratory at [facility] September 2019: Total  cholesterol 128, triglycerides 151, LDL 48, HDL 34.  BUN 15 creatinine 0.77.  TSH 1.78.  RADIOLOGY: No results found.  IMPRESSION: 1. Essential hypertension   2. Palpitation   3. Mitral valve prolapse   4. Hyperlipidemia, unspecified hyperlipidemia type   5. Hypothyroidism, unspecified type   6. RBBB      ASSESSMENT AND PLAN: Misty Newman is a 72 year old female who has a history of hypertension, hypothyroidism, mitral valve prolapse, and palpitations.  She has been on metoprolol 100 mg twice a day, Maxide every other day, and remotely had also been started  on Lanoxin 0.125 mg, which he has been taking for many years.  Although it was not on her medical record she also has been taking ramipril 10 mg twice a day.  Her blood pressure today is elevated.  Her blood pressures at home for the most part seem to be fairly well controlled.  Undoubtedly she does have blood pressure lability.  She is not having significant recurrent palpitations.  I have recommended that she discontinue digoxin.  I have also recommended that she reduce her ramipril from 10 mg twice a day down to 10 mg daily.  Her resting pulse is in the 90s and with her blood pressure elevation I am starting Cardizem CD 120 mg at bedtime.  He has right bundle branch block.  I am scheduling her for follow-up 2D echo Doppler study to reassess systolic and diastolic function.  He continues to be on rosuvastatin 10 mg daily and most recent lipid panel shows an LDL cholesterol excellent at 48.  She continues to be on levothyroxine at 100 mcg.  Most recent TSH is stable at 1.78.  There is no edema on her current regimen.  I have recommended that she have a follow-up evaluation with Almyra Deforest, PA-C in 3 months and I Newman schedule an appointment for follow-up with me in 6 months.  Time spent: 25 minutes  Troy Sine, MD, [facility] 08/19/2018 3:05 PM

## 2018-09-02 ENCOUNTER — Other Ambulatory Visit (HOSPITAL_COMMUNITY): Payer: Medicare Other

## 2018-09-08 ENCOUNTER — Other Ambulatory Visit (HOSPITAL_COMMUNITY): Payer: Medicare Other

## 2018-09-23 ENCOUNTER — Other Ambulatory Visit: Payer: Self-pay

## 2018-09-23 ENCOUNTER — Ambulatory Visit (HOSPITAL_COMMUNITY): Payer: Medicare Other | Attending: Cardiovascular Disease

## 2018-09-23 ENCOUNTER — Encounter (INDEPENDENT_AMBULATORY_CARE_PROVIDER_SITE_OTHER): Payer: Self-pay

## 2018-09-23 DIAGNOSIS — I10 Essential (primary) hypertension: Secondary | ICD-10-CM | POA: Insufficient documentation

## 2018-09-23 DIAGNOSIS — R002 Palpitations: Secondary | ICD-10-CM | POA: Diagnosis not present

## 2018-10-27 DIAGNOSIS — K219 Gastro-esophageal reflux disease without esophagitis: Secondary | ICD-10-CM | POA: Diagnosis not present

## 2018-10-27 DIAGNOSIS — R079 Chest pain, unspecified: Secondary | ICD-10-CM | POA: Diagnosis not present

## 2019-01-05 DIAGNOSIS — E118 Type 2 diabetes mellitus with unspecified complications: Secondary | ICD-10-CM | POA: Diagnosis not present

## 2019-01-05 DIAGNOSIS — R002 Palpitations: Secondary | ICD-10-CM | POA: Diagnosis not present

## 2019-01-05 DIAGNOSIS — I10 Essential (primary) hypertension: Secondary | ICD-10-CM | POA: Diagnosis not present

## 2019-01-05 DIAGNOSIS — E119 Type 2 diabetes mellitus without complications: Secondary | ICD-10-CM | POA: Diagnosis not present

## 2019-02-14 NOTE — Progress Notes (Signed)
Virtual Visit via Telephone Note   This visit type was conducted due to national recommendations for restrictions regarding the COVID-19 Pandemic (e.g. social distancing) in an effort to limit this patient's exposure and mitigate transmission in our community.  Due to her co-morbid illnesses, this patient is at least at moderate risk for complications without adequate follow up.  This format is felt to be most appropriate for this patient at this time.  The patient did not have access to video technology/had technical difficulties with video requiring transitioning to audio format only (telephone).  All issues noted in this document were discussed and addressed.  No physical exam could be performed with this format.  Please refer to the patient's chart for her  consent to telehealth for [facility].   Date:  02/15/2019   ID:  Misty Newman, DOB [birth year], MRN [id number]  Patient Location: Home Provider Location: Office  PCP:  Jani Gravel, MD  Cardiologist:  Shelva Majestic, MD  Electrophysiologist:  None   Evaluation Performed:  Follow-Up Visit  Chief Complaint:  Follow up, HTN, CAD  History of Present Illness:    Misty Newman is a 73 y.o. female with HTN, RBBB, HLD LDL goal < 70, chronic bilateral lower extremity edema, and mitral valve prolapse. She was seen in the ED 01/10/16 with palpitations found to have sinus tachycardia with RBBB and inferior T wave abnormality. She was on lopressor 100 mg BID for palpitations. She has a history of labile blood pressure. She was last seen in clinic with Dr. Claiborne Billings on 08/19/18. Per his note: she was taking metoprolol tartrate 100 mg twice a day, Lanoxin 0.125 mg daily, triamterene HCT 37.5/25 mg every other day and although it was not on her medical record she has been taking ramipril 10 mg twice a day. Dr. Claiborne Billings D/C'ed digoxin and reduced her ramipril from 10 mg BID to 10 mg daily. He also started cardizem 120 mg for HR in the 90s on 100 mg lopressor  BID. Repeat echo was ordered for RBBB. Echocardiogram with preserved EF, no wall motion abnormalities, but grade 2 DD. Left atrium was mildly dilated. Of note, this echo did not mention mitral valve prolapse, consistent with her 2017 echo.   When nursing called her with the echo results, she stated that she had not stopped the digoxin or started the cardizem as advised, but did decrease ramipril.   She presents for follow up. She states she is under a lot of stress with her husband - he is in ICU at [facility] with esophageal cancer. She was able to visit him last Friday for a few minutes. His prognosis is guarded. This was diagnosed 6 weeks ago.   She states her cardiac health is good. BP has been controlled. At home, pressures are 010-932T systolic. She says she couldn't tolerate the cardizem - she can't remember why. She is still taking digoxin, but she states she needs to take it. Apparently a doctor told her a long time ago that she would need to take it for the rest of her life. She has not had a digoxin level since 2017 and was 0.7 at that time. She is also still taking  Ramipril twice a day, despite Dr. Claiborne Billings decreasing this to daily.   The patient does not have symptoms concerning for COVID-19 infection (fever, chills, cough, or new shortness of breath).    Past Medical History:  Diagnosis Date   Allergy    seasonal   Anxiety  Arthritis    Cataract    removal bil   Essential hypertension    Heart murmur    a. 01/2016 Echo: EF 65-70%, no rwma, Gr1 DD, mild MR/TR, mild to mod PR, PASP 39mmHg.   History of chronic back pain    Hyperlipidemia    Hypothyroidism    Osteoarthritis    Palpitations    Past Surgical History:  Procedure Laterality Date   ABDOMINAL HYSTERECTOMY     ANTERIOR FUSION CERVICAL SPINE     3 cervical fusions   APPENDECTOMY     BREAST EXCISIONAL BIOPSY Right    benign   CHOLECYSTECTOMY     COLONOSCOPY     TONSILLECTOMY AND ADENOIDECTOMY        Current Meds  Medication Sig   aspirin 81 MG tablet Take 81 mg by mouth daily.     betamethasone valerate (VALISONE) 0.1 % cream betamethasone valerate 0.1 % topical cream   cyclobenzaprine (FLEXERIL) 10 MG tablet Take 10 mg by mouth daily.     diclofenac (VOLTAREN) 75 MG EC tablet Take 75 mg by mouth daily.     digoxin (LANOXIN) 0.125 MG tablet Take 0.125 mg by mouth daily.   Estradiol (YUVAFEM) 10 MCG TABS vaginal tablet Yuvafem 10 mcg vaginal tablet   fish oil-omega-3 fatty acids 1000 MG capsule Take 1 g by mouth daily.    glucosamine-chondroitin 500-400 MG tablet Take 1 tablet by mouth 2 (two) times daily.     lansoprazole (PREVACID) 30 MG capsule Take 30 mg by mouth daily at 12 noon.   levothyroxine (SYNTHROID, LEVOTHROID) 100 MCG tablet Take 100 mcg by mouth daily.     metoprolol tartrate (LOPRESSOR) 100 MG tablet Take 1 tablet (100 mg total) by mouth 2 (two) times daily.   Misc Natural Products (OSTEO BI-FLEX/5-LOXIN ADVANCED PO) Take by mouth 2 (two) times daily.   ramipril (ALTACE) 10 MG capsule Take 1 capsule (10 mg total) by mouth daily.   rosuvastatin (CRESTOR) 20 MG tablet Take 10 mg by mouth daily.    triamterene-hydrochlorothiazide (MAXZIDE-25) 37.5-25 MG tablet Take 1 tablet by mouth every other day.   [DISCONTINUED] metoprolol tartrate (LOPRESSOR) 100 MG tablet Take 100 mg by mouth 2 (two) times daily.   Current Facility-Administered Medications for the 02/15/19 encounter (Telemedicine) with Ledora Bottcher, PA  Medication   0.9 %  sodium chloride infusion   0.9 %  sodium chloride infusion     Allergies:   Butorphanol tartrate, Cardizem [diltiazem hcl], Clarithromycin, Meperidine hcl, Naloxone, Penicillins, Sulfa antibiotics, and Sulfonamide derivatives   Social History   Tobacco Use   Smoking status: Never Smoker   Smokeless tobacco: Never Used  Substance Use Topics   Alcohol use: No   Drug use: No     Family Hx: The patient's  family history includes Colon cancer (age of onset: 1) in her sister; Colon polyps in her sister. There is no history of Esophageal cancer, Pancreatic cancer, Prostate cancer, Rectal cancer, or Stomach cancer.  ROS:   Please see the history of present illness.     All other systems reviewed and are negative.   Prior CV studies:   The following studies were reviewed today:  Echo 09/23/18: Study Conclusions - Left ventricle: The cavity size was normal. Systolic function was   normal. The estimated ejection fraction was in the range of 55%   to 60%. Wall motion was normal; there were no regional wall   motion abnormalities. Features are consistent with a  pseudonormal   left ventricular filling pattern, with concomitant abnormal   relaxation and increased filling pressure (grade 2 diastolic   dysfunction). - Left atrium: The atrium was mildly dilated.  Labs/Other Tests and Data Reviewed:    EKG:  An ECG dated 08/19/18 was personally reviewed today and demonstrated:  sinus rhythm, heart rate 90, RBBB (old)  Recent Labs: 04/28/2018: BUN 14; Creatinine, Ser 0.82; Magnesium 1.8; Potassium 4.3; Sodium 140   Recent Lipid Panel Lab Results  Component Value Date/Time   CHOL  07/01/2008 06:12 AM    128        ATP III CLASSIFICATION:  <200     mg/dL   Desirable  200-239  mg/dL   Borderline High  >=240    mg/dL   High   TRIG 93 07/01/2008 06:12 AM   HDL 25 (L) 07/01/2008 06:12 AM   CHOLHDL 5.1 07/01/2008 06:12 AM   LDLCALC  07/01/2008 06:12 AM    84        Total Cholesterol/HDL:CHD Risk Coronary Heart Disease Risk Table                     Men   Women  1/2 Average Risk   3.4   3.3    Wt Readings from Last 3 Encounters:  02/15/19 160 lb (72.6 kg)  08/19/18 161 lb 6.4 oz (73.2 kg)  04/28/18 162 lb 9.6 oz (73.8 kg)     Objective:    Vital Signs:  BP 130/70    Pulse 85    Ht 5\' 7"  (1.702 m)    Wt 160 lb (72.6 kg)    BMI 25.06 kg/m    VITAL SIGNS:  reviewed GEN:  no acute  distress RESPIRATORY:  normal respiratory effort, symmetric expansion NEURO:  alert and oriented x 3, no obvious focal deficit PSYCH:  normal affect  ASSESSMENT & PLAN:    Medication management Taking: Digoxin 0.125 mg daily Lopressor 100 BID  triamterene HCT 37.5/25 mg - every other day (dehydrates her if taken every day) Ramipril 10 mg BID crestor 10 mg  Fish oil x 2 daily  Not taking: cardizem - she states she didn't tolerate it, but can't remember the reason  - will collect digoxin level and CMP   Labile HTN Pressure is controlled on the above regimen.    Hyperlipidemia [facility] lipid profile 01/05/19: Total chol: 133 LDL HDL: 32 Trig   History of palpitations She denies palpitations.    Chronic diastolic heart failure Recent echo with grade 2 DD. She is euvolemic on exam.   Diabetes Her A1c is elevated 7.7%. she is working with her PCP on this.    COVID-19 Education: The signs and symptoms of COVID-19 were discussed with the patient and how to seek care for testing (follow up with PCP or arrange E-visit).  The importance of social distancing was discussed today.  Time:   Today, I have spent 21 minutes with the patient with telehealth technology discussing the above problems.     Medication Adjustments/Labs and Tests Ordered: Current medicines are reviewed at length with the patient today.  Concerns regarding medicines are outlined above.   Tests Ordered: Orders Placed This Encounter  Procedures   Digoxin level   Comprehensive metabolic panel    Medication Changes: No orders of the defined types were placed in this encounter.   Follow Up:  Virtual Visit or In Person in 1 year(s)  Signed, Ledora Bottcher, PA  02/15/2019  10:57 AM    [facility]

## 2019-02-15 ENCOUNTER — Encounter: Payer: Self-pay | Admitting: Physician Assistant

## 2019-02-15 ENCOUNTER — Telehealth: Payer: Medicare Other | Admitting: Cardiovascular Disease

## 2019-02-15 ENCOUNTER — Telehealth (INDEPENDENT_AMBULATORY_CARE_PROVIDER_SITE_OTHER): Payer: Medicare Other | Admitting: Physician Assistant

## 2019-02-15 ENCOUNTER — Other Ambulatory Visit: Payer: Self-pay

## 2019-02-15 VITALS — BP 130/70 | HR 85 | Ht 67.0 in | Wt 160.0 lb

## 2019-02-15 DIAGNOSIS — Z79899 Other long term (current) drug therapy: Secondary | ICD-10-CM

## 2019-02-15 DIAGNOSIS — I10 Essential (primary) hypertension: Secondary | ICD-10-CM | POA: Diagnosis not present

## 2019-02-15 DIAGNOSIS — I5032 Chronic diastolic (congestive) heart failure: Secondary | ICD-10-CM

## 2019-02-15 DIAGNOSIS — Z794 Long term (current) use of insulin: Secondary | ICD-10-CM

## 2019-02-15 DIAGNOSIS — R002 Palpitations: Secondary | ICD-10-CM | POA: Diagnosis not present

## 2019-02-15 DIAGNOSIS — E118 Type 2 diabetes mellitus with unspecified complications: Secondary | ICD-10-CM

## 2019-02-15 DIAGNOSIS — E785 Hyperlipidemia, unspecified: Secondary | ICD-10-CM

## 2019-02-15 MED ORDER — METOPROLOL TARTRATE 100 MG PO TABS: 100.0000 mg | ORAL_TABLET | Freq: Two times a day (BID) | ORAL | 5 refills | Status: AC

## 2019-02-15 NOTE — Patient Instructions (Signed)
Medication Instructions:  Your physician recommends that you continue on your current medications as directed. Please refer to the Current Medication list given to you today.  If you need a refill on your cardiac medications before your next appointment, please call your pharmacy.   Lab work: Your physician recommends that you return for a CMET and Digoxin Level this week. Your lab slips will be here in our office with our lab technician. No appointment is needed.  If you have labs (blood work) drawn today and your tests are completely normal, you will receive your results only by: Marland Kitchen MyChart Message (if you have MyChart) OR . A paper copy in the mail If you have any lab test that is abnormal or we need to change your treatment, we will call you to review the results.  Follow-Up: At [facility], you and your health needs are our priority.  As part of our continuing mission to provide you with exceptional heart care, we have created designated Provider Care Teams.  These Care Teams include your primary Cardiologist (physician) and Advanced Practice Providers (APPs -  Physician Assistants and Nurse Practitioners) who all work together to provide you with the care you need, when you need it. You will need a follow up appointment in 12 months.  Please call our office 2 months in advance to schedule this appointment.  You may see Shelva Majestic, MD or one of the following Advanced Practice Providers on your designated Care Team: [place], [place] . Fabian Sharp, PA-C

## 2019-02-16 DIAGNOSIS — R002 Palpitations: Secondary | ICD-10-CM | POA: Diagnosis not present

## 2019-02-16 DIAGNOSIS — I5032 Chronic diastolic (congestive) heart failure: Secondary | ICD-10-CM | POA: Diagnosis not present

## 2019-02-16 DIAGNOSIS — Z79899 Other long term (current) drug therapy: Secondary | ICD-10-CM | POA: Diagnosis not present

## 2019-02-16 DIAGNOSIS — I10 Essential (primary) hypertension: Secondary | ICD-10-CM | POA: Diagnosis not present

## 2019-02-16 DIAGNOSIS — E785 Hyperlipidemia, unspecified: Secondary | ICD-10-CM | POA: Diagnosis not present

## 2019-02-17 LAB — COMPREHENSIVE METABOLIC PANEL
ALT: 22 IU/L (ref 0–32)
AST: 21 IU/L (ref 0–40)
Albumin/Globulin Ratio: 1.8 (ref 1.2–2.2)
Albumin: 4.6 g/dL (ref 3.8–4.8)
Alkaline Phosphatase: 96 IU/L (ref 39–117)
BUN/Creatinine Ratio: 18 (ref 12–28)
BUN: 16 mg/dL (ref 8–27)
Bilirubin Total: 0.4 mg/dL (ref 0.0–1.2)
CO2: 25 mmol/L (ref 20–29)
Calcium: 9.8 mg/dL (ref 8.7–10.3)
Chloride: 99 mmol/L (ref 96–106)
Creatinine, Ser: 0.88 mg/dL (ref 0.57–1.00)
GFR calc Af Amer: 77 mL/min/{1.73_m2} (ref >59)
GFR calc non Af Amer: 67 mL/min/{1.73_m2} (ref >59)
Globulin, Total: 2.6 g/dL (ref 1.5–4.5)
Glucose: 184 mg/dL — ABNORMAL HIGH (ref 65–99)
Potassium: 5 mmol/L (ref 3.5–5.2)
Sodium: 137 mmol/L (ref 134–144)
Total Protein: 7.2 g/dL (ref 6.0–8.5)

## 2019-02-17 LAB — DIGOXIN LEVEL: Digoxin, Serum: 1.2 ng/mL — ABNORMAL HIGH (ref 0.5–0.9)

## 2019-02-22 ENCOUNTER — Other Ambulatory Visit: Payer: Self-pay | Admitting: Physician Assistant

## 2019-02-22 ENCOUNTER — Encounter: Payer: Self-pay | Admitting: Physician Assistant

## 2019-02-22 DIAGNOSIS — R7889 Finding of other specified substances, not normally found in blood: Secondary | ICD-10-CM

## 2019-02-25 ENCOUNTER — Other Ambulatory Visit: Payer: Self-pay | Admitting: Internal Medicine

## 2019-02-25 DIAGNOSIS — Z1231 Encounter for screening mammogram for malignant neoplasm of breast: Secondary | ICD-10-CM

## 2019-03-03 ENCOUNTER — Other Ambulatory Visit: Payer: Self-pay

## 2019-03-03 ENCOUNTER — Ambulatory Visit (INDEPENDENT_AMBULATORY_CARE_PROVIDER_SITE_OTHER): Payer: Medicare Other | Admitting: Cardiovascular Disease

## 2019-03-03 ENCOUNTER — Encounter: Payer: Self-pay | Admitting: Cardiovascular Disease

## 2019-03-03 VITALS — BP 172/92 | HR 87 | Temp 97.6°F | Ht 67.0 in | Wt 149.0 lb

## 2019-03-03 DIAGNOSIS — I4510 Unspecified right bundle-branch block: Secondary | ICD-10-CM

## 2019-03-03 DIAGNOSIS — E039 Hypothyroidism, unspecified: Secondary | ICD-10-CM | POA: Diagnosis not present

## 2019-03-03 DIAGNOSIS — E118 Type 2 diabetes mellitus with unspecified complications: Secondary | ICD-10-CM

## 2019-03-03 DIAGNOSIS — Z794 Long term (current) use of insulin: Secondary | ICD-10-CM

## 2019-03-03 DIAGNOSIS — I10 Essential (primary) hypertension: Secondary | ICD-10-CM

## 2019-03-03 DIAGNOSIS — E785 Hyperlipidemia, unspecified: Secondary | ICD-10-CM

## 2019-03-03 MED ORDER — AMLODIPINE BESYLATE 5 MG PO TABS: 5.0000 mg | ORAL_TABLET | Freq: Every day | ORAL | 3 refills | Status: DC

## 2019-03-03 NOTE — Patient Instructions (Addendum)
Medication Instructions:  Start Amlodipine 5 mg- start 0.5 tablet for the next two weeks, if blood pressure is above 135 increase to a whole pill. If you need a refill on your cardiac medications before your next appointment, please call your pharmacy.   Follow-Up: At [facility], you and your health needs are our priority.  As part of our continuing mission to provide you with exceptional heart care, we have created designated Provider Care Teams.  These Care Teams include your primary Cardiologist (physician) and Advanced Practice Providers (APPs -  Physician Assistants and Nurse Practitioners) who all work together to provide you with the care you need, when you need it. You will need a follow up appointment in 6 months.  Please call our office 2 months in advance to schedule this appointment.  You may see Shelva Majestic, MD or one of the following Advanced Practice Providers on your designated Care Team: [place], [place] . Fabian Sharp, PA-C

## 2019-03-03 NOTE — Progress Notes (Signed)
Patient ID: Misty Newman, female   DOB: 08/05/1946, 73 y.o.   MRN: [id number]     Primary MD: Dr. Jani Gravel  PATIENT PROFILE: Misty Newman is a 73 y.o. female who presents for an 8 month f/u cardiology.  HPI:  Misty Newman has a history of hypertension, hyperlipidemia, thyroid disease, mitral valve prolapse, and palpitations.  Remotely, she had been on Ramipril for blood pressure control, and also had Maxide.  She was  evaluated in the emergency room on 01/10/2016 when after she was awakened by her husband from sleep.  She began to notice palpitations.  Her initial ECG had shown sinus tachycardia at 117 bpm with right bundle branch block and inferior T-wave abnormality.  She admits to chronic swelling in her ankles bilaterally.  She has issues with arthritis involving her back and has been taking Voltaren 75 mg daily.  She is a history of hypothyroidism and has been on thyroid replacement with levothyroxine 100 g.  I last saw her in May 2017 she was on  metoprolol 100 mg twice a day for her palpitations and a long time ago was also started on Lanoxin by Dr. Rollene Fare.  She has a history of hyperlipidemia was on Crestor 10 mg.    In 2017, she was given a trial of valsartan but apparently her blood pressure became low and this was discontinued.    When she saw Almyra Deforest, [facility] in August 2019, she had blood pressure lability.  He recommended that she obtain daily monitor of her blood pressures.  She had blood pressure lability and at times her blood pressures was 180s but at home most of the time her blood pressures in the 470J with diastolics in the 62E.  She was taking metoprolol tartrate 100 mg twice a day, Lanoxin 0.125 mg daily, triamterene HCT 37.5/25 mg every other day and although it was not on her medical record she has been taking ramipril 10 mg twice a day.  She has had issues over the past year with diverticulitis and a tooth abscess.  She underwent a root canal and also has required a dental  extraction.    I last saw her in December 2019.  At that time I recommended that she discontinue digoxin and reduce ramipril from 10 mg twice a day down to 10 mg daily.  Her resting pulse was in the 90s and with her blood pressure elevation I added Cardizem CD 120 mg at bedtime.  She has chronic right bundle branch block.  I scheduled her for an echo Doppler study which was done on September 23, 2018 which demonstrated an EF of 55 to 60%.  She had normal wall motion.  There was grade 2 diastolic dysfunction.  Her left atrium was mildly dilated.  She was last evaluated on February 15, 2019 by Fabian Sharp, PA in a telemedicine visit.  She apparently was not taking the Cardizem but could not remember the reason why she could not tolerate it.  Apparently she never changed her digoxin or ramipril was still on the prior dosing.  Presently she admits to being under considerable stress.  Her husband died this past 10-27-22 on 2019-02-28 and today on 03-05-2019 her brother died.  She has been off digoxin since June 16 and actually feels better.  She admits to trace ankle swelling which is insignificant.  She presents for evaluation.  Past Medical History:  Diagnosis Date   Allergy    seasonal  Anxiety    Arthritis    Cataract    removal bil   Essential hypertension    Heart murmur    a. 01/2016 Echo: EF 65-70%, no rwma, Gr1 DD, mild MR/TR, mild to mod PR, PASP 41mHg.   History of chronic back pain    Hyperlipidemia    Hypothyroidism    Osteoarthritis    Palpitations     Past Surgical History:  Procedure Laterality Date   ABDOMINAL HYSTERECTOMY     ANTERIOR FUSION CERVICAL SPINE     3 cervical fusions   APPENDECTOMY     BREAST EXCISIONAL BIOPSY Right    benign   CHOLECYSTECTOMY     COLONOSCOPY     TONSILLECTOMY AND ADENOIDECTOMY      Allergies  Allergen Reactions   Butorphanol Tartrate     REACTION: sweating, increased heartrate   Cardizem [Diltiazem Hcl] Other  (See Comments)    Per pt cause b/p to drop low   Clarithromycin     Can't remember   Meperidine Hcl     REACTION: itching     Naloxone     Pt questions allergies, is unaware of ever having, does not sound familiar   Penicillins     REACTION: unspecified- as a child   Sulfa Antibiotics    Sulfonamide Derivatives     REACTION: does not remember    Current Outpatient Medications  Medication Sig Dispense Refill   betamethasone valerate (VALISONE) 0.1 % cream betamethasone valerate 0.1 % topical cream     diclofenac (VOLTAREN) 75 MG EC tablet Take 75 mg by mouth daily.       Estradiol (YUVAFEM) 10 MCG TABS vaginal tablet Yuvafem 10 mcg vaginal tablet     fish oil-omega-3 fatty acids 1000 MG capsule Take 1 g by mouth daily.      glucosamine-chondroitin 500-400 MG tablet Take 1 tablet by mouth 2 (two) times daily.       lansoprazole (PREVACID) 30 MG capsule Take 30 mg by mouth daily at 12 noon.     levothyroxine (SYNTHROID, LEVOTHROID) 100 MCG tablet Take 100 mcg by mouth daily.       metoprolol tartrate (LOPRESSOR) 100 MG tablet Take 1 tablet (100 mg total) by mouth 2 (two) times daily. 30 tablet 5   Misc Natural Products (OSTEO BI-FLEX/5-LOXIN ADVANCED PO) Take by mouth 2 (two) times daily.     rosuvastatin (CRESTOR) 20 MG tablet Take 10 mg by mouth daily.      triamterene-hydrochlorothiazide (MAXZIDE-25) 37.5-25 MG tablet Take 1 tablet by mouth every other day.     amLODipine (NORVASC) 5 MG tablet Take 1 tablet (5 mg total) by mouth daily. 180 tablet 3   aspirin 81 MG tablet Take 81 mg by mouth daily.       cyclobenzaprine (FLEXERIL) 10 MG tablet Take 10 mg by mouth daily.       digoxin (LANOXIN) 0.125 MG tablet Take 0.125 mg by mouth daily.     ramipril (ALTACE) 10 MG capsule Take 1 capsule (10 mg total) by mouth daily. 90 capsule 3   Current Facility-Administered Medications  Medication Dose Route Frequency Provider Last Rate Last Dose   0.9 %  sodium  chloride infusion  500 mL Intravenous Continuous BLafayette Dragon MD       0.9 %  sodium chloride infusion  500 mL Intravenous Continuous Danis, HKirke Corin MD        Social History   Socioeconomic History   Marital  status: Married    Spouse name: Not on file   Number of children: Not on file   Years of education: Not on file   Highest education level: Not on file  Occupational History   Not on file  Social Needs   Financial resource strain: Not on file   Food insecurity    Worry: Not on file    Inability: Not on file   Transportation needs    Medical: Not on file    Non-medical: Not on file  Tobacco Use   Smoking status: Never Smoker   Smokeless tobacco: Never Used  Substance and Sexual Activity   Alcohol use: No   Drug use: No   Sexual activity: Not on file  Lifestyle   Physical activity    Days per week: Not on file    Minutes per session: Not on file   Stress: Not on file  Relationships   Social connections    Talks on phone: Not on file    Gets together: Not on file    Attends religious service: Not on file    Active member of club or organization: Not on file    Attends meetings of clubs or organizations: Not on file    Relationship status: Not on file   Intimate partner violence    Fear of current or ex partner: Not on file    Emotionally abused: Not on file    Physically abused: Not on file    Forced sexual activity: Not on file  Other Topics Concern   Not on file  Social History Narrative   Not on file   Additional social history is notable in that she was married for 50 years.  She has been widowed since February 26, 2019 . She does not have any children.  She is retired.  She completed 12 grade.  He watches a 49-year-old 3 days per week.  There is no history of tobacco use or alcohol use.  She does not exercise.  Family History  Problem Relation Age of Onset   Colon polyps Sister    Colon cancer Sister 51   Esophageal cancer Neg  Hx    Pancreatic cancer Neg Hx    Prostate cancer Neg Hx    Rectal cancer Neg Hx    Stomach cancer Neg Hx    Additional family history is notable in that her mother died at age 55.  Her father died at age 9 and had several strokes.  She has a sister who died at age 90 and also had a stroke.  ROS General: Negative; No fevers, chills, or night sweats HEENT: Negative; No changes in vision or hearing, sinus congestion, difficulty swallowing Pulmonary: Negative; No cough, wheezing, shortness of breath, hemoptysis Cardiovascular:  See HPI. GI: Negative; No nausea, vomiting, diarrhea, or abdominal pain GU: Negative; No dysuria, hematuria, or difficulty voiding Musculoskeletal: Positive for arthritis of her back. Hematologic/Oncologic: Negative; no easy bruising, bleeding Endocrine: Positive for hypothyroidism no diabetes Neuro: Negative; no changes in balance, headaches Skin: Negative; No rashes or skin lesions Psychiatric: Negative; No behavioral problems, depression Sleep: Negative; No daytime sleepiness, hypersomnolence, bruxism, restless legs, hypnogagnic hallucinations Other comprehensive 14 point system review is negative   Physical Exam BP (!) 172/92    Pulse 87    Temp 97.6 F (36.4 C)    Wt 149 lb (67.6 kg)    SpO2 98%    BMI 23.34 kg/m    Repeat blood pressure by me  160/88  Wt Readings from Last 3 Encounters:  03/03/19 149 lb (67.6 kg)  02/15/19 160 lb (72.6 kg)  08/19/18 161 lb 6.4 oz (73.2 kg)     Physical Exam BP (!) 172/92    Pulse 87    Temp 97.6 F (36.4 C)    Wt 149 lb (67.6 kg)    SpO2 98%    BMI 23.34 kg/m  General: Alert, oriented, no distress.  Skin: normal turgor, no rashes, warm and dry HEENT: Normocephalic, atraumatic. Pupils equal round and reactive to light; sclera anicteric; extraocular muscles intact; Fundi ** Nose without nasal septal hypertrophy Mouth/Parynx benign; Mallinpatti scale Neck: No JVD, no carotid bruits; normal carotid  upstroke Lungs: clear to ausculatation and percussion; no wheezing or rales Chest wall: without tenderness to palpitation Heart: PMI not displaced, RRR, s1 s2 normal, 1/6 systolic murmur, no diastolic murmur, no rubs, gallops, thrills, or heaves Abdomen: soft, nontender; no hepatosplenomehaly, BS+; abdominal aorta nontender and not dilated by palpation. Back: no CVA tenderness Pulses 2+ Musculoskeletal: full range of motion, normal strength, no joint deformities Extremities: Trivial ankle swelling no clubbing cyanosis or , Homan's sign negative  Neurologic: grossly nonfocal; Cranial nerves grossly wnl Psychologic: Normal mood and affect   ECG (independently read by me): Normal sinus rhythm at 83 bpm.  Right bundle branch block with repolarization changes.  QTc interval 470 ms.  No ectopy  September 2019 ECG (independently read by me): Normal sinus rhythm at 90 bpm.  Right bundle branch block with repolarization changes.  Borderline first-degree block with a PR interval of 2 8 ms.  QTc interval 481 ms.    May 2017 ECG (independently read by me): Normal sinus rhythm at 89 bpm.  Right bundle-branch block with repolarization changes.  LABS:  BMP Latest Ref Rng & Units 02/16/2019 04/28/2018 01/10/2016  Glucose 65 - 99 mg/dL 184(H) 162(H) 194(H)  BUN 8 - 27 mg/dL 16 14 22(H)  Creatinine 0.57 - 1.00 mg/dL 0.88 0.82 1.08(H)  BUN/Creat Ratio 12 - 28 18 17  -  Sodium 134 - 144 mmol/L 137 140 137  Potassium 3.5 - 5.2 mmol/L 5.0 4.3 4.2  Chloride 96 - 106 mmol/L 99 101 102  CO2 20 - 29 mmol/L 25 22 21(L)  Calcium 8.7 - 10.3 mg/dL 9.8 9.8 9.7     Hepatic Function Latest Ref Rng & Units 02/16/2019 06/30/2008  Total Protein 6.0 - 8.5 g/dL 7.2 7.4  Albumin 3.8 - 4.8 g/dL 4.6 4.0  AST 0 - 40 IU/L 21 32  ALT 0 - 32 IU/L 22 33  Alk Phosphatase 39 - 117 IU/L 96 91  Total Bilirubin 0.0 - 1.2 mg/dL 0.4 0.7    CBC Latest Ref Rng & Units 01/10/2016 07/01/2008 06/30/2008  WBC 4.0 - 10.5 K/uL 11.3(H) 8.9  14.4(H)  Hemoglobin 12.0 - 15.0 g/dL 11.6(L) 12.7 14.4  Hematocrit 36.0 - 46.0 % 35.4(L) 38.1 42.9  Platelets 150 - 400 K/uL 267 247 295   Lab Results  Component Value Date   MCV 89.4 01/10/2016   MCV 90.0 07/01/2008   MCV 90.2 06/30/2008   Lab Results  Component Value Date   TSH 0.937 01/10/2016   Lab Results  Component Value Date   HGBA1C  07/01/2008    5.7 (NOTE)   The ADA recommends the following therapeutic goal for glycemic   control related to Hgb A1C measurement:   Goal of Therapy:   < 7.0% Hgb A1C   Reference: American Diabetes Association: Clinical Practice  Recommendations 2008, Diabetes Care,  2008, 31:(Suppl 1).     BNP No results found for: BNP  ProBNP    Component Value Date/Time   PROBNP <30.0 07/01/2008 0612     Lipid Panel     Component Value Date/Time   CHOL  07/01/2008 0612    128        ATP III CLASSIFICATION:  <200     mg/dL   Desirable  200-239  mg/dL   Borderline High  >=240    mg/dL   High   TRIG 93 07/01/2008 0612   HDL 25 (L) 07/01/2008 0612   CHOLHDL 5.1 07/01/2008 0612   VLDL 19 07/01/2008 0612   LDLCALC  07/01/2008 0612    84        Total Cholesterol/HDL:CHD Risk Coronary Heart Disease Risk Table                     Men   Women  1/2 Average Risk   3.4   3.3    Recent laboratory at [facility] September 2019: Total cholesterol 128, triglycerides 151, LDL 48, HDL 34.  BUN 15 creatinine 0.77.  TSH 1.78.  RADIOLOGY: No results found.  IMPRESSION: 1. Essential hypertension   2. Hyperlipidemia, unspecified hyperlipidemia type   3. Hypothyroidism, unspecified type   4. Right bundle branch block   5. Hyperlipidemia LDL goal <70   6. Type 2 diabetes mellitus with complication, without long-term current use of insulin ([place])      ASSESSMENT AND PLAN: Misty Newman is a 73 year old female who has a history of hypertension, hypothyroidism, mitral valve prolapse, and palpitations.  She has been on metoprolol 100 mg twice  a day, Maxide every other day, and remotely had been started on Lanoxin 0.125 mg, which she had been taken for many years.  Since I last saw her, she underwent her echo Doppler study on September 23, 2018 which showed an EF of 55 to 60%.  She had grade 2 diastolic dysfunction and also elevated filling pressures. There was mild left atrial dilatation.  She has been under significant increased stress with the death of her husband this past weekend and the death of her brother who was ill in a nursing home today.  She is no longer taking digoxin.  Her blood pressure today is elevated despite taking metoprolol 100 mg twice a day, ramipril 10 mg twice a day and Maxide every other day.  With her blood pressure elevation and previous intolerance to Cardizem I will attempt a trial of amlodipine and she will initiate 5 mg at bedtime.  She continues to be on rosuvastatin for hyperlipidemia and is on 10 mg daily.  She has a history of hypothyroidism on levothyroxine 100 mcg.  She has lost weight from 161 down to 149 pounds.  She will monitor her blood pressure at home.  She is followed by primary physician for diabetes.  I will see her in 6 months for reevaluation or sooner if problems arise.   Time spent: 25 minutes  Troy Sine, MD, [facility] 03/05/2019 9:25 AM

## 2019-03-05 ENCOUNTER — Encounter: Payer: Self-pay | Admitting: Cardiovascular Disease

## 2019-03-21 ENCOUNTER — Other Ambulatory Visit: Payer: Self-pay

## 2019-03-21 NOTE — Patient Outreach (Signed)
[facility]) Care Management  03/21/2019  JAEDA BRUSO [birth year] [id number]   Medication Adherence call to Mrs. Belem Hintze Telephone call to Patient regarding Medication Adherence unable to reach patient. Mrs. Golubski is showing past due on Rosuvastatin 20 mg under [place].   [place] Management Direct Dial [phone]  Fax [phone] [email or web address]

## 2019-04-06 ENCOUNTER — Ambulatory Visit (INDEPENDENT_AMBULATORY_CARE_PROVIDER_SITE_OTHER): Payer: Medicare Other | Admitting: Gastroenterology

## 2019-04-06 ENCOUNTER — Encounter: Payer: Self-pay | Admitting: Gastroenterology

## 2019-04-06 VITALS — BP 150/80 | HR 96 | Temp 98.4°F | Ht 66.25 in | Wt 152.2 lb

## 2019-04-06 DIAGNOSIS — K5730 Diverticulosis of large intestine without perforation or abscess without bleeding: Secondary | ICD-10-CM | POA: Diagnosis not present

## 2019-04-06 DIAGNOSIS — R140 Abdominal distension (gaseous): Secondary | ICD-10-CM | POA: Diagnosis not present

## 2019-04-06 DIAGNOSIS — K529 Noninfective gastroenteritis and colitis, unspecified: Secondary | ICD-10-CM | POA: Diagnosis not present

## 2019-04-06 DIAGNOSIS — K625 Hemorrhage of anus and rectum: Secondary | ICD-10-CM

## 2019-04-06 NOTE — Progress Notes (Signed)
Misty Newman GI Progress Note  Chief Complaint: Abdominal pain and intermittent diarrhea  Subjective  History: Colonoscopy with me December 2017 for family history of colon cancer in her sister. Severe left-sided diverticulosis with tortuosity, otherwise normal colonoscopy.  Misty Newman sees me for the first time since her colonoscopy.  For the last couple of years she has had intermittent bloating and crampy lower abdominal pain.  She tends toward constipation attributed to diverticulosis, and takes stool softener and occasional MiraLAX for this.  However, every few weeks she has episodes of loose stool that might last a day or 2.  It is difficult to characterize, does not sound like there are any new medicines or any clear food triggers.  She is not really sure how long it is been going on, and thinks that perhaps the gas began after her colonoscopy. Recently when she had multiple loose BMs in a day, she had some blood on the paper and this alarmed her. Stress seems to worsen the symptoms, and there has been more of that since the recent loss of her husband to esophageal cancer.  Her appetite is dropped off with the recent stress as well.  ROS: Cardiovascular:  no chest pain Respiratory: no dyspnea  The patient's Past Medical, Family and Social History were reviewed and are on file in the EMR.  Objective:  Med list reviewed  Current Outpatient Medications:  .  cyclobenzaprine (FLEXERIL) 10 MG tablet, Take 10 mg by mouth daily.  , Disp: , Rfl:  .  diclofenac (VOLTAREN) 75 MG EC tablet, Take 75 mg by mouth daily.  , Disp: , Rfl:  .  fish oil-omega-3 fatty acids 1000 MG capsule, Take 1 g by mouth daily. , Disp: , Rfl:  .  glucosamine-chondroitin 500-400 MG tablet, Take 1 tablet by mouth 2 (two) times daily.  , Disp: , Rfl:  .  lansoprazole (PREVACID) 30 MG capsule, Take 30 mg by mouth daily at 12 noon., Disp: , Rfl:  .  levothyroxine (SYNTHROID, LEVOTHROID) 100 MCG tablet, Take 100 mcg  by mouth daily.  , Disp: , Rfl:  .  metoprolol tartrate (LOPRESSOR) 100 MG tablet, Take 1 tablet (100 mg total) by mouth 2 (two) times daily., Disp: 30 tablet, Rfl: 5 .  Misc Natural Products (OSTEO BI-FLEX/5-LOXIN ADVANCED PO), Take by mouth 2 (two) times daily., Disp: , Rfl:  .  ramipril (ALTACE) 10 MG capsule, Take 1 capsule (10 mg total) by mouth daily., Disp: 90 capsule, Rfl: 3 .  rosuvastatin (CRESTOR) 20 MG tablet, Take 10 mg by mouth daily. , Disp: , Rfl:  .  triamterene-hydrochlorothiazide (MAXZIDE-25) 37.5-25 MG tablet, Take 1 tablet by mouth every other day., Disp: , Rfl:   Current Facility-Administered Medications:  .  0.9 %  sodium chloride infusion, 500 mL, Intravenous, Continuous, Brodie, Misty Newman M, MD .  0.9 %  sodium chloride infusion, 500 mL, Intravenous, Continuous, Danis, Estill Cotta III, MD   Vital signs in last 24 hrs: Vitals:   04/06/19 1530  BP: (!) 150/80  Pulse: 96  Temp: 98.4 F (36.9 C)    Physical Exam  She is well-appearing, pleasant and conversational.  Becomes tearful when discussing her husband.  HEENT: sclera anicteric, oral mucosa moist without lesions  Neck: supple, no thyromegaly, JVD or lymphadenopathy  Cardiac: RRR without murmurs, S1S2 heard, no peripheral edema  Pulm: clear to auscultation bilaterally, normal RR and effort noted  Abdomen: soft, no tenderness, with active bowel sounds. No guarding or palpable  hepatosplenomegaly.  Skin; warm and dry, no jaundice or rash Rectal: Normal external, no fissure or tenderness or palpable internal lesion.  No recent data   @ASSESSMENTPLANBEGIN @ Assessment: Encounter Diagnoses  Name Primary?  . Abdominal bloating Yes  . Chronic diarrhea   . Rectal bleeding   . Diverticulosis of colon without hemorrhage    She mostly has constipation related to severe left colon diverticulosis.  There has been episodic loose stool of unclear trigger.  It does not sound typical for microscopic colitis.  I wonder  if it is a dietary trigger or some IBS.  Benign anal bleeding.  Plan: Written dietary advice given regarding bloating gas She has been taking Imodium with good effect, and would like to continue that.  She did not feel any prescription antispasmodic was needed.  She was encouraged to call me if the diarrhea escalates, as colonoscopy may be warranted to rule out microscopic colitis.  Total time 20 minutes, over half spent face-to-face with patient in counseling and coordination of care.   [name] III

## 2019-04-06 NOTE — Patient Instructions (Signed)
Food Guidelines for gas and bloating  Many people have difficulty digesting certain foods, causing a variety of distressing and embarrassing symptoms such as abdominal pain, bloating and gas.  These foods may need to be avoided or consumed in small amounts.  Here are some tips that might be helpful for you.  1.   Lactose intolerance is the difficulty or complete inability to digest lactose, the natural sugar in milk and anything made from milk.  This condition is harmless, common, and can begin any time during life.  Some people can digest a modest amount of lactose while others cannot tolerate any.  Also, not all dairy products contain equal amounts of lactose.  For example, hard cheeses such as parmesan have less lactose than soft cheeses such as cheddar.  Yogurt has less lactose than milk or cheese.  Many packaged foods (even many brands of bread) have milk, so read ingredient lists carefully.  It is difficult to test for lactose intolerance, so just try avoiding lactose as much as possible for a week and see what happens with your symptoms.  If you seem to be lactose intolerant, the best plan is to avoid it (but make sure you get calcium from another source).  The next best thing is to use lactase enzyme supplements, available over the counter everywhere.  Just know that many lactose intolerant people need to take several tablets with each serving of dairy to avoid symptoms.  Lastly, a lot of restaurant food is made with milk or butter.  Many are things you might not suspect, such as mashed potatoes, rice and pasta (cooked with butter) and "grilled" items.  If you are lactose intolerant, it never hurts to ask your server what has milk or butter.  2.   Fiber is an important part of your diet, but not all fiber is well-tolerated.  Insoluble fiber such as bran is often consumed by normal gut bacteria and converted into gas.  Soluble fiber such as oats, squash, carrots and green beans are typically tolerated  better.  3.   Some types of carbohydrates can be poorly digested.  Examples include: fructose (apples, cherries, pears, raisins and other dried fruits), fructans (onions, zucchini, large amounts of wheat), sorbitol/mannitol/xylitol and sucralose/Splenda (common artificial sweeteners), and raffinose (lentils, broccoli, cabbage, asparagus, brussel sprouts, many types of beans).  Do a web search for FODMAP diet and you will find helpful information. Beano, a dietary supplement, will often help with raffinose-containing foods.  As with lactase tablets, you may need several per serving.  4.   Whenever possible, avoid processed food&meats and chemical additives.  High fructose corn syrup, a common sweetener, may be difficult to digest.  Eggs and soy (comes from the soybean, and added to many foods now) are other common bloating/gassy foods.  - Dr. Molly Savarino [facility]  

## 2019-04-12 ENCOUNTER — Other Ambulatory Visit: Payer: Self-pay

## 2019-04-12 ENCOUNTER — Ambulatory Visit
Admission: RE | Admit: 2019-04-12 | Discharge: 2019-04-12 | Disposition: A | Discharge status: Home / Self Care | Payer: Medicare Other | Source: Ambulatory Visit | Attending: Internal Medicine | Admitting: Internal Medicine

## 2019-04-12 DIAGNOSIS — Z1231 Encounter for screening mammogram for malignant neoplasm of breast: Secondary | ICD-10-CM | POA: Diagnosis not present

## 2019-04-13 ENCOUNTER — Other Ambulatory Visit: Payer: Self-pay | Admitting: Internal Medicine

## 2019-04-13 DIAGNOSIS — L820 Inflamed seborrheic keratosis: Secondary | ICD-10-CM | POA: Diagnosis not present

## 2019-04-13 DIAGNOSIS — R928 Other abnormal and inconclusive findings on diagnostic imaging of breast: Secondary | ICD-10-CM

## 2019-04-13 DIAGNOSIS — D485 Neoplasm of uncertain behavior of skin: Secondary | ICD-10-CM | POA: Diagnosis not present

## 2019-04-13 DIAGNOSIS — D2272 Melanocytic nevi of left lower limb, including hip: Secondary | ICD-10-CM | POA: Diagnosis not present

## 2019-04-13 DIAGNOSIS — L858 Other specified epidermal thickening: Secondary | ICD-10-CM | POA: Diagnosis not present

## 2019-04-13 DIAGNOSIS — D225 Melanocytic nevi of trunk: Secondary | ICD-10-CM | POA: Diagnosis not present

## 2019-04-18 ENCOUNTER — Ambulatory Visit
Admission: RE | Admit: 2019-04-18 | Discharge: 2019-04-18 | Disposition: A | Discharge status: Home / Self Care | Payer: Medicare Other | Source: Ambulatory Visit | Attending: Internal Medicine | Admitting: Internal Medicine

## 2019-04-18 ENCOUNTER — Other Ambulatory Visit: Payer: Self-pay

## 2019-04-18 DIAGNOSIS — R928 Other abnormal and inconclusive findings on diagnostic imaging of breast: Secondary | ICD-10-CM

## 2019-04-18 DIAGNOSIS — N6321 Unspecified lump in the left breast, upper outer quadrant: Secondary | ICD-10-CM | POA: Diagnosis not present

## 2019-05-11 ENCOUNTER — Other Ambulatory Visit: Payer: Self-pay

## 2019-05-11 NOTE — Patient Outreach (Signed)
[facility]) Care Management  05/11/2019  Misty Newman [birth year] [id number]   Medication Adherence call to Mrs. Misty Newman [facility] Identifiers Verify spoke with patient she is past due on Ramipril 10 mg patient explain she is only taking 1 tablet daily instead of 2 tablets because her blood pressure gets to low and for now she wants to keep it this way until her blood pressure in regulate. Misty Newman is showing past due under [place].   [facility] Direct Dial [phone]  Fax [phone] [email or web address]

## 2019-06-21 DIAGNOSIS — E118 Type 2 diabetes mellitus with unspecified complications: Secondary | ICD-10-CM | POA: Diagnosis not present

## 2019-06-21 DIAGNOSIS — M546 Pain in thoracic spine: Secondary | ICD-10-CM | POA: Diagnosis not present

## 2019-06-21 DIAGNOSIS — E039 Hypothyroidism, unspecified: Secondary | ICD-10-CM | POA: Diagnosis not present

## 2019-06-21 DIAGNOSIS — R079 Chest pain, unspecified: Secondary | ICD-10-CM | POA: Diagnosis not present

## 2019-06-21 DIAGNOSIS — M5134 Other intervertebral disc degeneration, thoracic region: Secondary | ICD-10-CM | POA: Diagnosis not present

## 2019-06-21 DIAGNOSIS — K219 Gastro-esophageal reflux disease without esophagitis: Secondary | ICD-10-CM | POA: Diagnosis not present

## 2019-07-04 DIAGNOSIS — H10013 Acute follicular conjunctivitis, bilateral: Secondary | ICD-10-CM | POA: Diagnosis not present

## 2019-07-14 DIAGNOSIS — E789 Disorder of lipoprotein metabolism, unspecified: Secondary | ICD-10-CM | POA: Diagnosis not present

## 2019-07-14 DIAGNOSIS — I10 Essential (primary) hypertension: Secondary | ICD-10-CM | POA: Diagnosis not present

## 2019-07-14 DIAGNOSIS — E118 Type 2 diabetes mellitus with unspecified complications: Secondary | ICD-10-CM | POA: Diagnosis not present

## 2019-07-14 DIAGNOSIS — E039 Hypothyroidism, unspecified: Secondary | ICD-10-CM | POA: Diagnosis not present

## 2019-07-27 ENCOUNTER — Other Ambulatory Visit: Payer: Self-pay | Admitting: Internal Medicine

## 2019-08-31 DIAGNOSIS — L648 Other androgenic alopecia: Secondary | ICD-10-CM | POA: Diagnosis not present

## 2019-08-31 DIAGNOSIS — L650 Telogen effluvium: Secondary | ICD-10-CM | POA: Diagnosis not present

## 2019-08-31 DIAGNOSIS — L821 Other seborrheic keratosis: Secondary | ICD-10-CM | POA: Diagnosis not present

## 2019-09-07 ENCOUNTER — Encounter: Payer: Self-pay | Admitting: Physician Assistant

## 2019-09-07 ENCOUNTER — Telehealth (INDEPENDENT_AMBULATORY_CARE_PROVIDER_SITE_OTHER): Payer: Medicare Other | Admitting: Physician Assistant

## 2019-09-07 ENCOUNTER — Telehealth: Payer: Self-pay

## 2019-09-07 VITALS — BP 112/61 | HR 72 | Ht 67.0 in | Wt 158.0 lb

## 2019-09-07 DIAGNOSIS — E039 Hypothyroidism, unspecified: Secondary | ICD-10-CM

## 2019-09-07 DIAGNOSIS — R002 Palpitations: Secondary | ICD-10-CM

## 2019-09-07 DIAGNOSIS — I10 Essential (primary) hypertension: Secondary | ICD-10-CM

## 2019-09-07 DIAGNOSIS — E785 Hyperlipidemia, unspecified: Secondary | ICD-10-CM

## 2019-09-07 NOTE — Telephone Encounter (Signed)

## 2019-09-07 NOTE — Patient Instructions (Signed)
Medication Instructions:  Your physician recommends that you continue on your current medications as directed. Please refer to the Current Medication list given to you today. *If you need a refill on your cardiac medications before your next appointment, please call your pharmacy*  Lab Work: None  If you have labs (blood work) drawn today and your tests are completely normal, you will receive your results only by: Marland Kitchen MyChart Message (if you have MyChart) OR . A paper copy in the mail If you have any lab test that is abnormal or we need to change your treatment, we will call you to review the results.  Testing/Procedures: None   Follow-Up: At [facility], you and your health needs are our priority.  As part of our continuing mission to provide you with exceptional heart care, we have created designated Provider Care Teams.  These Care Teams include your primary Cardiologist (physician) and Advanced Practice Providers (APPs -  Physician Assistants and Nurse Practitioners) who all work together to provide you with the care you need, when you need it.  Your next appointment:   6 month(s)  The format for your next appointment:   In Person  Provider:   Shelva Majestic, MD  Other Instructions

## 2019-09-07 NOTE — Telephone Encounter (Signed)
Contacted patient to discuss AVS Instructions. Gave patient Hao's recommendations from today's virtual office visit. Informed patient that someone from the scheduling dept will be in contact with them to schedule their follow up appt. Patient voiced understanding and AVS mailed.

## 2019-09-07 NOTE — Progress Notes (Signed)
Virtual Visit via Telephone Note   This visit type was conducted due to national recommendations for restrictions regarding the COVID-19 Pandemic (e.g. social distancing) in an effort to limit this patient's exposure and mitigate transmission in our community.  Due to her co-morbid illnesses, this patient is at least at moderate risk for complications without adequate follow up.  This format is felt to be most appropriate for this patient at this time.  The patient did not have access to video technology/had technical difficulties with video requiring transitioning to audio format only (telephone).  All issues noted in this document were discussed and addressed.  No physical exam could be performed with this format.  Please refer to the patient's chart for her  consent to telehealth for [facility].   Date:  09/08/2019   ID:  Misty Newman, DOB Feb 22, 1946, MRN [id number]  Patient Location: Home Provider Location: Home  PCP:  Jani Gravel, MD  Cardiologist:  Shelva Majestic, MD  Electrophysiologist:  None   Evaluation Performed:  Follow-Up Visit  Chief Complaint:  6 month followup  History of Present Illness:    Misty Newman is a 74 y.o. female with past medical history of hypertension, hyperlipidemia, thyroid disease, mitral valve prolapse and palpitation.  Patient was most recently seen by Dr. Claiborne Billings in July 2020, she was under significant amount of stress at the time as her husband just passed away.  In the past, she had history of labile hypertension and tachycardia.  Dr. Claiborne Billings has placed her on Cardizem on top of the metoprolol, however Cardizem was later discontinued due to intolerance.  Patient presents today for virtual visit.  Both her husband and her brother passed away within 1 week of each other in July.  She is currently living by herself.  She keeps her self isolated and has not had any recent fever, chills or cough.  She denies any recent chest pain, shortness of breath,  lower extremity edema, orthopnea or PND.  She does mention that she occasionally has ankle edema, this quickly resolved by the next morning.  Her blood pressure is very well controlled on the current therapy.  I recommended continue on the current therapy and we will see her back in 6 months.  The patient does not have symptoms concerning for COVID-19 infection (fever, chills, cough, or new shortness of breath).    Past Medical History:  Diagnosis Date  . Allergy    seasonal  . Anxiety   . Arthritis   . Cataract    removal bil  . Essential hypertension   . Heart murmur    a. 01/2016 Echo: EF 65-70%, no rwma, Gr1 DD, mild MR/TR, mild to mod PR, PASP 16mmHg.  Marland Kitchen History of chronic back pain   . Hyperlipidemia   . Hypothyroidism   . Osteoarthritis   . Palpitations    Past Surgical History:  Procedure Laterality Date  . ABDOMINAL HYSTERECTOMY    . ANTERIOR FUSION CERVICAL SPINE     3 cervical fusions  . APPENDECTOMY    . BREAST EXCISIONAL BIOPSY Right    benign  . CHOLECYSTECTOMY    . COLONOSCOPY    . TONSILLECTOMY AND ADENOIDECTOMY       Current Meds  Medication Sig  . cholecalciferol (VITAMIN D3) 25 MCG (1000 UT) tablet Take 1,000 Units by mouth daily.  . cyclobenzaprine (FLEXERIL) 10 MG tablet Take 10 mg by mouth daily.    . diclofenac (VOLTAREN) 75 MG EC tablet Take  75 mg by mouth daily.    . fish oil-omega-3 fatty acids 1000 MG capsule Take 1 g by mouth daily.   . lansoprazole (PREVACID) 30 MG capsule Take 30 mg by mouth daily at 12 noon.  Marland Kitchen levothyroxine (SYNTHROID, LEVOTHROID) 100 MCG tablet Take 100 mcg by mouth daily.    . metoprolol tartrate (LOPRESSOR) 100 MG tablet Take 1 tablet (100 mg total) by mouth 2 (two) times daily.  . Misc Natural Products (OSTEO BI-FLEX/5-LOXIN ADVANCED PO) Take by mouth 2 (two) times daily.  . ramipril (ALTACE) 10 MG capsule Take 1 capsule (10 mg total) by mouth daily. (Patient taking differently: Take 10 mg by mouth 2 (two) times daily. )   . rosuvastatin (CRESTOR) 20 MG tablet Take 10 mg by mouth daily.   Marland Kitchen triamterene-hydrochlorothiazide (MAXZIDE-25) 37.5-25 MG tablet Take 1 tablet by mouth every other day.   Current Facility-Administered Medications for the 09/07/19 encounter (Telemedicine) with Almyra Deforest, PA  Medication  . 0.9 %  sodium chloride infusion  . 0.9 %  sodium chloride infusion     Allergies:   Butorphanol tartrate, Cardizem [diltiazem hcl], Clarithromycin, Meperidine hcl, Naloxone, Penicillins, Sulfa antibiotics, and Sulfonamide derivatives   Social History   Tobacco Use  . Smoking status: Never Smoker  . Smokeless tobacco: Never Used  Substance Use Topics  . Alcohol use: No  . Drug use: No     Family Hx: The patient's family history includes Colon cancer (age of onset: 44) in her sister; Colon polyps in her sister. There is no history of Esophageal cancer, Pancreatic cancer, Prostate cancer, Rectal cancer, or Stomach cancer.  ROS:   Please see the history of present illness.     All other systems reviewed and are negative.   Prior CV studies:   The following studies were reviewed today:  Echo 09/23/2018 LV EF: 55% -   60% Study Conclusions  - Left ventricle: The cavity size was normal. Systolic function was   normal. The estimated ejection fraction was in the range of 55%   to 60%. Wall motion was normal; there were no regional wall   motion abnormalities. Features are consistent with a pseudonormal   left ventricular filling pattern, with concomitant abnormal   relaxation and increased filling pressure (grade 2 diastolic   dysfunction). - Left atrium: The atrium was mildly dilated.  Labs/Other Tests and Data Reviewed:    EKG:  An ECG dated 03/03/2019 was personally reviewed today and demonstrated:  Normal sinus rhythm with right bundle branch block  Recent Labs: 02/16/2019: ALT 22; BUN 16; Creatinine, Ser 0.88; Potassium 5.0; Sodium 137   Recent Lipid Panel Lab Results  Component  Value Date/Time   CHOL  07/01/2008 06:12 AM    128        ATP III CLASSIFICATION:  <200     mg/dL   Desirable  200-239  mg/dL   Borderline High  >=240    mg/dL   High   TRIG 93 07/01/2008 06:12 AM   HDL 25 (L) 07/01/2008 06:12 AM   CHOLHDL 5.1 07/01/2008 06:12 AM   LDLCALC  07/01/2008 06:12 AM    84        Total Cholesterol/HDL:CHD Risk Coronary Heart Disease Risk Table                     Men   Women  1/2 Average Risk   3.4   3.3    Wt Readings from Last 3 Encounters:  09/07/19 158 lb (71.7 kg)  04/06/19 152 lb 4 oz (69.1 kg)  03/03/19 149 lb (67.6 kg)     Objective:    Vital Signs:  BP 112/61   Pulse 72   Ht 5\' 7"  (1.702 m)   Wt 158 lb (71.7 kg)   BMI 24.75 kg/m    VITAL SIGNS:  reviewed  ASSESSMENT & PLAN:    1. Hypertension: Blood pressure very well controlled on current medication.  2. Hyperlipidemia: On fish oil and Crestor  3. Hypothyroidism: On Synthroid  4. Palpitation: Well controlled on metoprolol   COVID-19 Education: The signs and symptoms of COVID-19 were discussed with the patient and how to seek care for testing (follow up with PCP or arrange E-visit).  The importance of social distancing was discussed today.  Time:   Today, I have spent 8 minutes with the patient with telehealth technology discussing the above problems.     Medication Adjustments/Labs and Tests Ordered: Current medicines are reviewed at length with the patient today.  Concerns regarding medicines are outlined above.   Tests Ordered: No orders of the defined types were placed in this encounter.   Medication Changes: No orders of the defined types were placed in this encounter.   Follow Up:  Either In Person or Virtual in 6 month(s)  Signed, Almyra Deforest, [place]  09/08/2019 11:26 PM    [place]

## 2019-11-15 DIAGNOSIS — K219 Gastro-esophageal reflux disease without esophagitis: Secondary | ICD-10-CM | POA: Diagnosis not present

## 2019-11-29 DIAGNOSIS — K219 Gastro-esophageal reflux disease without esophagitis: Secondary | ICD-10-CM | POA: Diagnosis not present

## 2019-11-30 ENCOUNTER — Ambulatory Visit: Payer: Medicare Other | Admitting: Physician Assistant

## 2019-11-30 ENCOUNTER — Other Ambulatory Visit: Payer: Self-pay

## 2019-11-30 ENCOUNTER — Encounter: Payer: Self-pay | Admitting: Physician Assistant

## 2019-11-30 VITALS — BP 150/88 | HR 77 | Ht 67.0 in | Wt 168.6 lb

## 2019-11-30 DIAGNOSIS — R55 Syncope and collapse: Secondary | ICD-10-CM | POA: Diagnosis not present

## 2019-11-30 DIAGNOSIS — R002 Palpitations: Secondary | ICD-10-CM

## 2019-11-30 DIAGNOSIS — I10 Essential (primary) hypertension: Secondary | ICD-10-CM

## 2019-11-30 DIAGNOSIS — E785 Hyperlipidemia, unspecified: Secondary | ICD-10-CM | POA: Diagnosis not present

## 2019-11-30 DIAGNOSIS — E039 Hypothyroidism, unspecified: Secondary | ICD-10-CM

## 2019-11-30 DIAGNOSIS — R600 Localized edema: Secondary | ICD-10-CM

## 2019-11-30 DIAGNOSIS — I341 Nonrheumatic mitral (valve) prolapse: Secondary | ICD-10-CM | POA: Diagnosis not present

## 2019-11-30 DIAGNOSIS — Z01810 Encounter for preprocedural cardiovascular examination: Secondary | ICD-10-CM

## 2019-11-30 NOTE — Progress Notes (Signed)
Cardiology Office Note:    Date:  11/30/2019   ID:  Misty Newman, DOB August 19, 1946, MRN [id number]  PCP:  Jani Gravel, MD  Cardiologist:  Shelva Majestic, MD  Electrophysiologist:  None   Referring MD: Jani Gravel, MD   Chief Complaint  Patient presents with  . Follow-up    seen for Dr. Claiborne Billings    History of Present Illness:    Misty Newman is a 74 y.o. female with a hx of HTN, HLD, thyroid disease, mitral valve prolapse and palpitation.  Patient was last seen by Dr. Claiborne Billings in July 2020, she was under significant amount of stress at the time as her husband just passed away.  In the past, she had history of labile hypertension and tachycardia.  Dr. Claiborne Billings has placed her on Cardizem on top of the metoprolol, however Cardizem was later discontinued due to intolerance.  Echocardiogram obtained on 09/23/2018 showed EF 55 to 60%, grade 2 DD, mild LAE.  I last saw the patient in January 2021 at which time, she mentions she occasionally has some ankle edema, otherwise she was doing well.  Patient presents today for cardiology office visit.  She denies any recent chest pain or shortness of breath.  She did mention several months ago, she had an episode of passing out.  She says she took 3 laxatives that day and had excessive sweating before she passed out.  She did discuss this with her PCP who felt this is likely low blood sugar.  Since then, she has been feeling well without any further issue.  EKG today showed normal sinus rhythm with right bundle branch block, no significant ST-T wave changes.  EKG is unchanged when compared to the previous EKG.  At this time, I will hold off on additional work-up.  She can follow-up in 6 months.    Past Medical History:  Diagnosis Date  . Allergy    seasonal  . Anxiety   . Arthritis   . Cataract    removal bil  . Essential hypertension   . Heart murmur    a. 01/2016 Echo: EF 65-70%, no rwma, Gr1 DD, mild MR/TR, mild to mod PR, PASP 101mmHg.  Marland Kitchen History of  chronic back pain   . Hyperlipidemia   . Hypothyroidism   . Osteoarthritis   . Palpitations     Past Surgical History:  Procedure Laterality Date  . ABDOMINAL HYSTERECTOMY    . ANTERIOR FUSION CERVICAL SPINE     3 cervical fusions  . APPENDECTOMY    . BREAST EXCISIONAL BIOPSY Right    benign  . CHOLECYSTECTOMY    . COLONOSCOPY    . TONSILLECTOMY AND ADENOIDECTOMY      Current Medications: Current Meds  Medication Sig  . cholecalciferol (VITAMIN D3) 25 MCG (1000 UT) tablet Take 1,000 Units by mouth daily.  . cyclobenzaprine (FLEXERIL) 10 MG tablet Take 10 mg by mouth daily.    . diclofenac (VOLTAREN) 75 MG EC tablet Take 75 mg by mouth daily.    . fish oil-omega-3 fatty acids 1000 MG capsule Take 1 g by mouth daily.   . Lancets (ONETOUCH DELICA PLUS [id number]) MISC TEST ONCE A DAY AS DIRECTED  . lansoprazole (PREVACID) 30 MG capsule Take 30 mg by mouth daily at 12 noon.  Marland Kitchen levothyroxine (SYNTHROID, LEVOTHROID) 100 MCG tablet Take 100 mcg by mouth daily.    . metoprolol tartrate (LOPRESSOR) 100 MG tablet Take 1 tablet (100 mg total) by mouth 2 (two) times  daily.  . Misc Natural Products (OSTEO BI-FLEX/5-LOXIN ADVANCED PO) Take by mouth 2 (two) times daily.  Glory Rosebush VERIO test strip USE TO TEST SUGAR 3 TIMES DAILY  . pantoprazole (PROTONIX) 40 MG tablet Take 40 mg by mouth daily.  . ramipril (ALTACE) 10 MG capsule Take 1 capsule (10 mg total) by mouth daily. (Patient taking differently: Take 10 mg by mouth 2 (two) times daily. )  . rosuvastatin (CRESTOR) 20 MG tablet Take 10 mg by mouth daily.   Marland Kitchen triamterene-hydrochlorothiazide (MAXZIDE-25) 37.5-25 MG tablet Take 1 tablet by mouth every other day.   Current Facility-Administered Medications for the 11/30/19 encounter (Office Visit) with Almyra Deforest, PA  Medication  . 0.9 %  sodium chloride infusion  . 0.9 %  sodium chloride infusion     Allergies:   Butorphanol tartrate, Cardizem [diltiazem hcl], Clarithromycin, Meperidine  hcl, Naloxone, Penicillins, Sulfa antibiotics, and Sulfonamide derivatives   Social History   Socioeconomic History  . Marital status: Married    Spouse name: Not on file  . Number of children: Not on file  . Years of education: Not on file  . Highest education level: Not on file  Occupational History  . Not on file  Tobacco Use  . Smoking status: Never Smoker  . Smokeless tobacco: Never Used  Substance and Sexual Activity  . Alcohol use: No  . Drug use: No  . Sexual activity: Not on file  Other Topics Concern  . Not on file  Social History Narrative  . Not on file   Social Determinants of Health   Financial Resource Strain:   . Difficulty of Paying Living Expenses:   Food Insecurity:   . Worried About Charity fundraiser in the Last Year:   . Arboriculturist in the Last Year:   Transportation Needs:   . Film/video editor (Medical):   Marland Kitchen Lack of Transportation (Non-Medical):   Physical Activity:   . Days of Exercise per Week:   . Minutes of Exercise per Session:   Stress:   . Feeling of Stress :   Social Connections:   . Frequency of Communication with Friends and Family:   . Frequency of Social Gatherings with Friends and Family:   . Attends Religious Services:   . Active Member of Clubs or Organizations:   . Attends Archivist Meetings:   Marland Kitchen Marital Status:      Family History: The patient's family history includes Colon cancer (age of onset: 37) in her sister; Colon polyps in her sister. There is no history of Esophageal cancer, Pancreatic cancer, Prostate cancer, Rectal cancer, or Stomach cancer.  ROS:   Please see the history of present illness.     All other systems reviewed and are negative.  EKGs/Labs/Other Studies Reviewed:    The following studies were reviewed today:  Echo 09/23/2018 LV EF: 55% -  60%  Study Conclusions   - Left ventricle: The cavity size was normal. Systolic function was  normal. The estimated ejection  fraction was in the range of 55%  to 60%. Wall motion was normal; there were no regional wall  motion abnormalities. Features are consistent with a pseudonormal  left ventricular filling pattern, with concomitant abnormal  relaxation and increased filling pressure (grade 2 diastolic  dysfunction).  - Left atrium: The atrium was mildly dilated.   EKG:  EKG is ordered today.  The ekg ordered today demonstrates normal sinus rhythm with right bundle branch block.  No significant ST-T wave changes.  Recent Labs: 02/16/2019: ALT 22; BUN 16; Creatinine, Ser 0.88; Potassium 5.0; Sodium 137  Recent Lipid Panel    Component Value Date/Time   CHOL  07/01/2008 0612    128        ATP III CLASSIFICATION:  <200     mg/dL   Desirable  200-239  mg/dL   Borderline High  >=240    mg/dL   High   TRIG 93 07/01/2008 0612   HDL 25 (L) 07/01/2008 0612   CHOLHDL 5.1 07/01/2008 0612   VLDL 19 07/01/2008 0612   LDLCALC  07/01/2008 0612    84        Total Cholesterol/HDL:CHD Risk Coronary Heart Disease Risk Table                     Men   Women  1/2 Average Risk   3.4   3.3    Physical Exam:    VS:  BP (!) 150/88   Pulse 77   Ht 5\' 7"  (1.702 m)   Wt 168 lb 9.6 oz (76.5 kg)   SpO2 93%   BMI 26.41 kg/m     Wt Readings from Last 3 Encounters:  11/30/19 168 lb 9.6 oz (76.5 kg)  09/07/19 158 lb (71.7 kg)  04/06/19 152 lb 4 oz (69.1 kg)     GEN:  Well nourished, well developed in no acute distress HEENT: Normal NECK: No JVD; No carotid bruits LYMPHATICS: No lymphadenopathy CARDIAC: RRR, no murmurs, rubs, gallops RESPIRATORY:  Clear to auscultation without rales, wheezing or rhonchi  ABDOMEN: Soft, non-tender, non-distended MUSCULOSKELETAL:  No edema; No deformity  SKIN: Warm and dry NEUROLOGIC:  Alert and oriented x 3 PSYCHIATRIC:  Normal affect   ASSESSMENT:    1. Palpitation   2. Syncope and collapse   3. Mitral valve prolapse   4. Essential hypertension   5.  Hyperlipidemia LDL goal <70   6. Hypothyroidism, unspecified type   7. Leg edema   8. Preop cardiovascular exam    PLAN:    In order of problems listed above:  1. Palpitation: Well-controlled on metoprolol.  2. Syncope: Patient mention today that she had a passing out spell several months ago after she took 3 laxatives for GI issue.  This started with excessive sweating and dizziness.  She sought medical medical attention by talking to her PCP who mentioned to her that this is likely a low blood sugar.  Fortunately, she has not had any recurrence of symptoms lately.  Given lack of recurrence, I will hold off on her monitor.  3. Mitral valve prolapse: Last echocardiogram obtained on 09/23/2018 did not show any mitral valve prolapse, normal mitral valve morphology.  4. Hypertension: Suspect patient likely has whitecoat syndrome, her blood pressure is elevated today, however it is normal yesterday during her visit with her PCP.  At home, sometime her systolic blood pressure dropped into the low 100s.  I will hold off on adjusting her blood pressure medication.  5. Hyperlipidemia:Continue Crestor, will defer annual lab work to PCP  6. Hypothyroidism: On Synthroid  7. Leg edema: Her ankle edema usually occurs when she is on her feet all day long.  This is related to venous stasis.  I did not see any edema on physical exam today.  She is on Maxide at home.  Recommend leg elevation if she does have any recurrent leg edema.  8. Preoperative clearance: She mentioned she has  early satiety and abdominal fullness.  On physical exam, I did not see any sign of volume overload.  She says she is being referred to GI service and may ended up needing either endoscopy or colonoscopy in the future.  She would be a low risk patient for such procedure.   Medication Adjustments/Labs and Tests Ordered: Current medicines are reviewed at length with the patient today.  Concerns regarding medicines are outlined  above.  Orders Placed This Encounter  Procedures  . EKG 12-Lead   No orders of the defined types were placed in this encounter.   Patient Instructions  Medication Instructions:  Your physician recommends that you continue on your current medications as directed. Please refer to the Current Medication list given to you today.  *If you need a refill on your cardiac medications before your next appointment, please call your pharmacy*  Lab Work: NONE ordered at this time of appointment   If you have labs (blood work) drawn today and your tests are completely normal, you will receive your results only by: Marland Kitchen MyChart Message (if you have MyChart) OR . A paper copy in the mail If you have any lab test that is abnormal or we need to change your treatment, we will call you to review the results.  Testing/Procedures: NONE ordered at this time of appointment   Follow-Up: At [facility], you and your health needs are our priority.  As part of our continuing mission to provide you with exceptional heart care, we have created designated Provider Care Teams.  These Care Teams include your primary Cardiologist (physician) and Advanced Practice Providers (APPs -  Physician Assistants and Nurse Practitioners) who all work together to provide you with the care you need, when you need it.  We recommend signing up for the patient portal called "MyChart".  Sign up information is provided on this After Visit Summary.  MyChart is used to connect with patients for Virtual Visits (Telemedicine).  Patients are able to view lab/test results, encounter notes, upcoming appointments, etc.  Non-urgent messages can be sent to your provider as well.   To learn more about what you can do with MyChart, go to [email or web address].    Your next appointment:   6 month(s)  The format for your next appointment:   In Person  Provider:   Shelva Majestic, MD  Other Instructions      Signed, Almyra Deforest, [place]    11/30/2019 10:34 AM    [place]

## 2019-11-30 NOTE — Patient Instructions (Signed)
Medication Instructions:  Your physician recommends that you continue on your current medications as directed. Please refer to the Current Medication list given to you today.  *If you need a refill on your cardiac medications before your next appointment, please call your pharmacy*  Lab Work: NONE ordered at this time of appointment   If you have labs (blood work) drawn today and your tests are completely normal, you will receive your results only by: . MyChart Message (if you have MyChart) OR . A paper copy in the mail If you have any lab test that is abnormal or we need to change your treatment, we will call you to review the results.  Testing/Procedures: NONE ordered at this time of appointment   Follow-Up: At [facility], you and your health needs are our priority.  As part of our continuing mission to provide you with exceptional heart care, we have created designated Provider Care Teams.  These Care Teams include your primary Cardiologist (physician) and Advanced Practice Providers (APPs -  Physician Assistants and Nurse Practitioners) who all work together to provide you with the care you need, when you need it.  We recommend signing up for the patient portal called "MyChart".  Sign up information is provided on this After Visit Summary.  MyChart is used to connect with patients for Virtual Visits (Telemedicine).  Patients are able to view lab/test results, encounter notes, upcoming appointments, etc.  Non-urgent messages can be sent to your provider as well.   To learn more about what you can do with MyChart, go to [email or web address].    Your next appointment:   6 month(s)  The format for your next appointment:   In Person  Provider:   Thomas Kelly, MD  Other Instructions   

## 2020-03-01 ENCOUNTER — Other Ambulatory Visit: Payer: Self-pay | Admitting: Internal Medicine

## 2020-03-01 DIAGNOSIS — Z1231 Encounter for screening mammogram for malignant neoplasm of breast: Secondary | ICD-10-CM

## 2020-03-27 DIAGNOSIS — M5416 Radiculopathy, lumbar region: Secondary | ICD-10-CM | POA: Diagnosis not present

## 2020-04-12 ENCOUNTER — Other Ambulatory Visit: Payer: Self-pay

## 2020-04-12 ENCOUNTER — Ambulatory Visit
Admission: RE | Admit: 2020-04-12 | Discharge: 2020-04-12 | Disposition: A | Discharge status: Home / Self Care | Payer: Medicare Other | Source: Ambulatory Visit | Attending: Internal Medicine | Admitting: Internal Medicine

## 2020-04-12 DIAGNOSIS — Z1231 Encounter for screening mammogram for malignant neoplasm of breast: Secondary | ICD-10-CM | POA: Diagnosis not present

## 2020-05-14 ENCOUNTER — Telehealth: Payer: Self-pay

## 2020-05-14 NOTE — Telephone Encounter (Signed)
I can see her as a work-in this Wednesday the 15th at [place] to our lab 3PM for STAT CBC, then come upstairs to our waiting room and I'll work her in.

## 2020-05-14 NOTE — Telephone Encounter (Signed)
Dr. Loletha Carrow, just an FYI   Patient called wanting advice. Pt was seen at [facility] yesterday, reported heavy rectal bleeding and they advised patient to go to the ED yesterday. Pt calling today states that she did not go to the ED due to her being afraid of being exposed to [place], she states that the numbers are high in the ED with COVID patients. Pt advised that we do not have any soon availability for her to be evaluated and the best place would be the ED, I offered patient several ED options and she stated that she will hold off another day to see if her symptoms get better, she reports that she ate chinese chicken and rice and developed lower abdominal pain after, she reports having sweats and diarrhea, pt does have a hx of hemorrhoids - she states that she doesn't think its hemorrhoids. Pt advised that she needed to be evaluated so that they could check labs, advised patient that with losing a large amount of blood she can begin to feel weak, dizzy, and overall unwell. Advised patient to look out for those signs and to be evaluated if she notices any worsening symptoms.

## 2020-05-15 ENCOUNTER — Other Ambulatory Visit: Payer: Self-pay

## 2020-05-15 DIAGNOSIS — K529 Noninfective gastroenteritis and colitis, unspecified: Secondary | ICD-10-CM

## 2020-05-15 DIAGNOSIS — K625 Hemorrhage of anus and rectum: Secondary | ICD-10-CM

## 2020-05-15 NOTE — Telephone Encounter (Signed)
Left detailed message on patient's voicemail that Dr. Loletha Carrow can work her in tomorrow at 4 pm, advised patient to come by at 3 pm tomorrow to have labs drawn then proceed to the third floor to be worked in to be seen. Advised patient to return the call to let us know if she will be able to make that appt. Lab order and reminder in epic.

## 2020-05-15 NOTE — Telephone Encounter (Signed)
Spoke with patient, she will arrive at 3 pm for lab work then proceed to the third floor to be worked in per Dr. Loletha Carrow. Pt verbalized understanding. Made TT, CMA aware that patient was added to schedule.

## 2020-05-16 ENCOUNTER — Telehealth: Payer: Self-pay

## 2020-05-16 ENCOUNTER — Encounter: Payer: Self-pay | Admitting: Gastroenterology

## 2020-05-16 ENCOUNTER — Other Ambulatory Visit (INDEPENDENT_AMBULATORY_CARE_PROVIDER_SITE_OTHER): Payer: Medicare Other

## 2020-05-16 ENCOUNTER — Ambulatory Visit: Payer: Medicare Other | Admitting: Gastroenterology

## 2020-05-16 VITALS — BP 143/90 | HR 77 | Ht 66.0 in | Wt 167.0 lb

## 2020-05-16 DIAGNOSIS — R197 Diarrhea, unspecified: Secondary | ICD-10-CM | POA: Diagnosis not present

## 2020-05-16 DIAGNOSIS — K529 Noninfective gastroenteritis and colitis, unspecified: Secondary | ICD-10-CM

## 2020-05-16 DIAGNOSIS — K625 Hemorrhage of anus and rectum: Secondary | ICD-10-CM | POA: Diagnosis not present

## 2020-05-16 DIAGNOSIS — R109 Unspecified abdominal pain: Secondary | ICD-10-CM | POA: Diagnosis not present

## 2020-05-16 LAB — CBC WITH DIFFERENTIAL/PLATELET
Basophils Absolute: 0.1 10*3/uL (ref 0.0–0.1)
Basophils Relative: 1 % (ref 0.0–3.0)
Eosinophils Absolute: 0.3 10*3/uL (ref 0.0–0.7)
Eosinophils Relative: 2.4 % (ref 0.0–5.0)
HCT: 36.9 % (ref 36.0–46.0)
Hemoglobin: 12.3 g/dL (ref 12.0–15.0)
Lymphocytes Relative: 25 % (ref 12.0–46.0)
Lymphs Abs: 3 10*3/uL (ref 0.7–4.0)
MCHC: 33.5 g/dL (ref 30.0–36.0)
MCV: 88.5 fl (ref 78.0–100.0)
Monocytes Absolute: 0.8 10*3/uL (ref 0.1–1.0)
Monocytes Relative: 6.3 % (ref 3.0–12.0)
Neutro Abs: 7.8 10*3/uL — ABNORMAL HIGH (ref 1.4–7.7)
Neutrophils Relative %: 65.3 % (ref 43.0–77.0)
Platelets: 298 10*3/uL (ref 150.0–400.0)
RBC: 4.17 Mil/uL (ref 3.87–5.11)
RDW: 13.1 % (ref 11.5–15.5)
WBC: 12 10*3/uL — ABNORMAL HIGH (ref 4.0–10.5)

## 2020-05-16 MED ORDER — PROMETHAZINE HCL 25 MG PO TABS: 25.0000 mg | ORAL_TABLET | Freq: Three times a day (TID) | ORAL | 0 refills | Status: DC | PRN

## 2020-05-16 NOTE — Telephone Encounter (Signed)
Spoke with patient , she is aware that she will need to go by the lab at 3 pm today before her appointment. Pt verbalized understanding.

## 2020-05-16 NOTE — Telephone Encounter (Signed)
-----   Message from Yevette Edwards, RN sent at 05/15/2020  7:17 AM EDT ----- STAT CBC at 3 pm today, order in epic

## 2020-05-16 NOTE — Progress Notes (Signed)
[place] [facility] Progress Note  Chief Complaint: Rectal bleeding  Subjective  History: Last seen August 2020 for abdominal bloating with altered bowel habits.  Chronic constipation from diverticulosis but with occasional loose stool.  Intermittent rectal bleeding felt likely to be benign anorectal bleeding. Last colonoscopy with me in December 2017 for screening due to family history of colon cancer in her sister was a normal study except left-sided diverticulosis.  Dayelin called the office 2 days ago because a day before she had sudden onset of severe lower abdominal cramps with vomiting and diarrhea along with rectal bleeding.  She says it occurred not long after eating some leftover [place] food with chicken and rice.  Bleeding stopped by late Monday after she had called our office, the diarrhea and vomiting had resolved by then as well.  Last evening after eating a light diet she had some lower abdominal bloating and "rumbling" for which she took some Gas-X.  ROS: Cardiovascular:  no chest pain Respiratory: no dyspnea  The patient's Past Medical, Family and Social History were reviewed and are on file in the EMR.  Objective:  Med list reviewed  Current Outpatient Medications:  .  cholecalciferol (VITAMIN D3) 25 MCG (1000 UT) tablet, Take 1,000 Units by mouth daily., Disp: , Rfl:  .  cyclobenzaprine (FLEXERIL) 10 MG tablet, Take 10 mg by mouth daily.  , Disp: , Rfl:  .  diclofenac (VOLTAREN) 75 MG EC tablet, Take 75 mg by mouth daily.  , Disp: , Rfl:  .  fish oil-omega-3 fatty acids 1000 MG capsule, Take 1 g by mouth daily. , Disp: , Rfl:  .  Lancets (ONETOUCH DELICA PLUS [id number]) MISC, TEST ONCE A DAY AS DIRECTED, Disp: , Rfl:  .  lansoprazole (PREVACID) 30 MG capsule, Take 30 mg by mouth daily at 12 noon., Disp: , Rfl:  .  levothyroxine (SYNTHROID, LEVOTHROID) 100 MCG tablet, Take 100 mcg by mouth daily.  , Disp: , Rfl:  .  metoprolol tartrate (LOPRESSOR) 100 MG tablet, Take  1 tablet (100 mg total) by mouth 2 (two) times daily., Disp: 30 tablet, Rfl: 5 .  Misc Natural Products (OSTEO BI-FLEX/5-LOXIN ADVANCED PO), Take by mouth 2 (two) times daily., Disp: , Rfl:  .  ONETOUCH VERIO test strip, USE TO TEST SUGAR 3 TIMES DAILY, Disp: , Rfl:  .  rosuvastatin (CRESTOR) 20 MG tablet, Take 10 mg by mouth daily. , Disp: , Rfl:  .  triamterene-hydrochlorothiazide (MAXZIDE-25) 37.5-25 MG tablet, Take 1 tablet by mouth every other day., Disp: , Rfl:  .  ramipril (ALTACE) 10 MG capsule, Take 1 capsule (10 mg total) by mouth daily. (Patient taking differently: Take 10 mg by mouth 2 (two) times daily. ), Disp: 90 capsule, Rfl: 3  Current Facility-Administered Medications:  .  0.9 %  sodium chloride infusion, 500 mL, Intravenous, Continuous, Brodie, Dora M, MD .  0.9 %  sodium chloride infusion, 500 mL, Intravenous, Continuous, Danis, Estill Cotta III, MD   Vital signs in last 24 hrs: Vitals:   05/16/20 1548  BP: (!) 143/90  Pulse: 77  SpO2: 98%    Physical Exam  Well-appearing.  Well-hydrated.  She has been able to eat and drink over the last couple days.  HEENT: sclera anicteric, oral mucosa moist without lesions  Neck: supple, no thyromegaly, JVD or lymphadenopathy  Cardiac: RRR without murmurs, S1S2 heard, no peripheral edema  Pulm: clear to auscultation bilaterally, normal RR and effort noted  Abdomen: soft, no  tenderness, with active bowel sounds. No guarding or palpable hepatosplenomegaly.  Skin; warm and dry, no jaundice or rash Rectal: Normal perianal exam.  No fissure, tenderness or palpable internal lesion.  No blood on the glove.  Labs:  CBC Latest Ref Rng & Units 05/16/2020 01/10/2016 07/01/2008  WBC 4.0 - 10.5 K/uL 12.0(H) 11.3(H) 8.9  Hemoglobin 12.0 - 15.0 g/dL 12.3 11.6(L) 12.7  Hematocrit 36 - 46 % 36.9 35.4(L) 38.1  Platelets 150 - 400 K/uL 298.0 267 247   Stat CBC was run at her visit today with results as  above ___________________________________________ Radiologic studies:   ____________________________________________ Other:   _____________________________________________ Assessment & Plan  Assessment: Encounter Diagnoses  Name Primary?  . Acute abdominal pain Yes  . Acute diarrhea   . Rectal bleeding     Seleta had food poisoning 3 days ago with resolution of symptoms about 48 hours ago.  She still has some residual lower abdominal bloating which I expect will pass.  Benign anorectal bleeding, though possibly a mild infectious colitis could have occurred that is now resolving. I reassured her, and she asked for a small supply of Phenergan to have on hand in case this happens again.  Emalyn had called her sister and brought a couple of Phenergan tablets to get through this episode.  Plan:  See me as needed 6 tablets of Phenergan prescribed.  20 minutes were spent on this encounter (including chart review, history/exam, counseling/coordination of care, and documentation)  [name] III

## 2020-05-16 NOTE — Patient Instructions (Signed)
If you are age 72 or older, your body mass index should be between 23-30. Your Body mass index is 26.95 kg/m. If this is out of the aforementioned range listed, please consider follow up with your Primary Care Provider.  If you are age 43 or younger, your body mass index should be between 19-25. Your Body mass index is 26.95 kg/m. If this is out of the aformentioned range listed, please consider follow up with your Primary Care Provider.   Follow up as needed   It was a pleasure to see you today!  Dr. Loletha Carrow

## 2020-05-29 DIAGNOSIS — R739 Hyperglycemia, unspecified: Secondary | ICD-10-CM | POA: Diagnosis not present

## 2020-05-29 DIAGNOSIS — E039 Hypothyroidism, unspecified: Secondary | ICD-10-CM | POA: Diagnosis not present

## 2020-05-29 DIAGNOSIS — E118 Type 2 diabetes mellitus with unspecified complications: Secondary | ICD-10-CM | POA: Diagnosis not present

## 2020-05-29 DIAGNOSIS — I10 Essential (primary) hypertension: Secondary | ICD-10-CM | POA: Diagnosis not present

## 2020-06-08 DIAGNOSIS — I10 Essential (primary) hypertension: Secondary | ICD-10-CM | POA: Diagnosis not present

## 2020-06-08 DIAGNOSIS — K219 Gastro-esophageal reflux disease without esophagitis: Secondary | ICD-10-CM | POA: Diagnosis not present

## 2020-06-08 DIAGNOSIS — R7989 Other specified abnormal findings of blood chemistry: Secondary | ICD-10-CM | POA: Diagnosis not present

## 2020-06-08 DIAGNOSIS — E119 Type 2 diabetes mellitus without complications: Secondary | ICD-10-CM | POA: Diagnosis not present

## 2020-06-08 DIAGNOSIS — E039 Hypothyroidism, unspecified: Secondary | ICD-10-CM | POA: Diagnosis not present

## 2020-06-08 DIAGNOSIS — E789 Disorder of lipoprotein metabolism, unspecified: Secondary | ICD-10-CM | POA: Diagnosis not present

## 2020-08-06 ENCOUNTER — Encounter: Payer: Self-pay | Admitting: Physician Assistant

## 2020-08-06 ENCOUNTER — Ambulatory Visit: Payer: Self-pay

## 2020-08-06 ENCOUNTER — Ambulatory Visit: Payer: Medicare Other | Admitting: Physician Assistant

## 2020-08-06 DIAGNOSIS — G8929 Other chronic pain: Secondary | ICD-10-CM

## 2020-08-06 DIAGNOSIS — M25562 Pain in left knee: Secondary | ICD-10-CM | POA: Diagnosis not present

## 2020-08-06 DIAGNOSIS — M25561 Pain in right knee: Secondary | ICD-10-CM

## 2020-08-06 MED ORDER — LIDOCAINE HCL 1 % IJ SOLN
3.0000 mL | INTRAMUSCULAR | Status: AC | PRN
  Administered 2020-08-06: 3 mL

## 2020-08-06 MED ORDER — METHYLPREDNISOLONE ACETATE 40 MG/ML IJ SUSP
40.0000 mg | INTRAMUSCULAR | Status: AC | PRN
  Administered 2020-08-06: 40 mg via INTRA_ARTICULAR

## 2020-08-06 NOTE — Progress Notes (Addendum)
Office Visit Note   [name]           Date of Birth: 17-Feb-1946           MRN: [id number] Visit Date: 08/06/2020              Requested by: Jani Gravel, [address],  [place] [address] PCP: Jani Gravel, MD   Assessment & Plan: Visit Diagnoses:  1. Chronic pain of right knee   2. Chronic pain of left knee     Plan: We will see how she does with the right knee injection.  Due to her being prediabetic and she is unsure what her glucose levels have been running recommend that she begin checking these again if her glucose levels are high over the next few days she has called her primary care physician as she is on no medications for this.  In regards to the left knee whenever we see her back in 2 weeks may aspirate and inject the knee at that time if she continues to have problems with the knee.  Questions were encouraged and answered at length.  Follow-Up Instructions: Return in about 2 weeks (around 08/20/2020).   Orders:  Orders Placed This Encounter  Procedures  . Large Joint Inj  . XR Knee 1-2 Views Left  . XR Knee 1-2 Views Right   No orders of the defined types were placed in this encounter.     Procedures: Large Joint Inj: R knee on 08/06/2020 5:06 PM Indications: pain Details: 22 G 1.5 in needle, anterolateral approach  Arthrogram: No  Medications: 3 mL lidocaine 1 %; 40 mg methylPREDNISolone acetate 40 MG/ML Outcome: tolerated well, no immediate complications Procedure, treatment alternatives, risks and benefits explained, specific risks discussed. Consent was given by the patient. Immediately prior to procedure a time out was called to verify the correct patient, procedure, equipment, support staff and site/side marked as required. Patient was prepped and draped in the usual sterile fashion.       Clinical Data: No additional findings.   Subjective: Chief Complaint  Patient presents with  . Left Knee - Pain  .  Right Knee - Pain    HPI Patient is 74 year old female who is well-known.by my service history of bilateral knee arthritis had previous cortisone injections right knee by Dr. Ninfa Linden back in 10/21/2017.  She has had no new injury to either knee.  She states she feels like her right knee is worse than her left most of the time.  Right knee does feel like it might give way at times.  She is taking diclofenac.  She has had no fevers or chills.  No recent vaccines.  Review of Systems Please see HPI otherwise negative  Objective: Vital Signs: There were no vitals taken for this visit.  Physical Exam Constitutional:      Appearance: She is not ill-appearing or diaphoretic.  Pulmonary:     Effort: Pulmonary effort is normal.  Neurological:     Mental Status: She is alert and oriented to person, place, and time.  Psychiatric:        Mood and Affect: Mood normal.     Ortho Exam Bilateral knees patellofemoral crepitus.  No instability valgus varus stressing.  No abnormal warmth erythema of either knee.  Right knee no effusion left knee positive effusion.  Nontender along medial lateral joint line of both knees. Specialty Comments:  No specialty comments available.  Imaging: XR Knee  1-2 Views Left  Result Date: 08/06/2020 Left knee 2 views: No acute fracture.  Tricompartmental arthritic changes with moderate patellofemoral changes moderate medial joint line narrowing and periarticular spurs off the lateral joint line.  XR Knee 1-2 Views Right  Result Date: 08/06/2020 Right knee 2 views: No acute fracture.  Moderate patellofemoral changes.  Moderate to severe narrowing medial joint line.  Lateral joint line with periarticular spurring.  Knee is well located.    PMFS History: Patient Active Problem List   Diagnosis Date Noted  . Unilateral primary osteoarthritis, right knee 01/06/2018  . Palpitations   . Essential hypertension 01/27/2016  . Hypothyroidism 01/27/2016  . Right bundle  branch block 01/27/2016  . Hyperlipidemia LDL goal <70 01/27/2016  . Bilateral lower extremity edema 01/27/2016   Past Medical History:  Diagnosis Date  . Allergy    seasonal  . Anxiety   . Arthritis   . Cataract    removal bil  . Essential hypertension   . Heart murmur    a. 01/2016 Echo: EF 65-70%, no rwma, Gr1 DD, mild MR/TR, mild to mod PR, PASP 31mmHg.  Marland Kitchen History of chronic back pain   . Hyperlipidemia   . Hypothyroidism   . Osteoarthritis   . Palpitations     Family History  Problem Relation Age of Onset  . Colon polyps Sister   . Colon cancer Sister 58  . Esophageal cancer Neg Hx   . Pancreatic cancer Neg Hx   . Prostate cancer Neg Hx   . Rectal cancer Neg Hx   . Stomach cancer Neg Hx     Past Surgical History:  Procedure Laterality Date  . ABDOMINAL HYSTERECTOMY    . ANTERIOR FUSION CERVICAL SPINE     3 cervical fusions  . APPENDECTOMY    . BREAST EXCISIONAL BIOPSY Right    benign  . CHOLECYSTECTOMY    . COLONOSCOPY    . TONSILLECTOMY AND ADENOIDECTOMY     Social History   Occupational History  . Not on file  Tobacco Use  . Smoking status: Never Smoker  . Smokeless tobacco: Never Used  Substance and Sexual Activity  . Alcohol use: No  . Drug use: No  . Sexual activity: Not on file

## 2020-08-20 ENCOUNTER — Encounter: Payer: Self-pay | Admitting: Physician Assistant

## 2020-08-20 ENCOUNTER — Ambulatory Visit: Payer: Medicare Other | Admitting: Physician Assistant

## 2020-08-20 DIAGNOSIS — M1711 Unilateral primary osteoarthritis, right knee: Secondary | ICD-10-CM

## 2020-08-20 DIAGNOSIS — M1712 Unilateral primary osteoarthritis, left knee: Secondary | ICD-10-CM

## 2020-08-20 MED ORDER — LIDOCAINE HCL 1 % IJ SOLN
5.0000 mL | INTRAMUSCULAR | Status: AC | PRN
  Administered 2020-08-20: 5 mL

## 2020-08-20 MED ORDER — METHYLPREDNISOLONE ACETATE 40 MG/ML IJ SUSP
40.0000 mg | INTRAMUSCULAR | Status: AC | PRN
  Administered 2020-08-20: 40 mg via INTRA_ARTICULAR

## 2020-08-20 NOTE — Progress Notes (Signed)
   Office Visit Note   [name]           Date of Birth: [birth year]           MRN: [id number] Visit Date: 08/20/2020    HPI: Misty Newman returns today status post right knee injection 08/06/2020.  She reports that the injection of the right knee helped considerably.  She still having pain in the left hand and swelling.  She feels as if the knee is tight. She states since the right knee is doing well and she does not want to proceed with the supplemental injection.   Physical exam: Left knee positive effusion.  Patient is tenderness along the medial joint line.  Tello femoral crepitus with passive range of motion.  No abnormal warmth erythema.            Assessment & Plan: Visit Diagnoses:  1. Unilateral primary osteoarthritis, left knee   2. Unilateral primary osteoarthritis, right knee     Plan:  She will work on quad strengthening both knees follow-up with [place] as needed knows to wait at least 3 months between cortisone injections to both knees.  Follow-Up Instructions: Return if symptoms worsen or fail to improve.   Orders:  Orders Placed This Encounter  Procedures  . Large Joint Inj: L knee   No orders of the defined types were placed in this encounter.     Procedures: Large Joint Inj: L knee on 08/20/2020 1:16 PM Indications: pain Details: 22 G 1.5 in needle, anterolateral approach  Arthrogram: No  Medications: 40 mg methylPREDNISolone acetate 40 MG/ML; 5 mL lidocaine 1 % Aspirate: 12 mL yellow Outcome: tolerated well, no immediate complications Procedure, treatment alternatives, risks and benefits explained, specific risks discussed. Consent was given by the patient. Immediately prior to procedure a time out was called to verify the correct patient, procedure, equipment, support staff and site/side marked as required. Patient was prepped and draped in the usual sterile fashion.

## 2020-09-06 DIAGNOSIS — M5416 Radiculopathy, lumbar region: Secondary | ICD-10-CM | POA: Diagnosis not present

## 2020-09-06 DIAGNOSIS — M545 Low back pain, unspecified: Secondary | ICD-10-CM | POA: Diagnosis not present

## 2020-09-06 DIAGNOSIS — I10 Essential (primary) hypertension: Secondary | ICD-10-CM | POA: Diagnosis not present

## 2020-09-14 ENCOUNTER — Other Ambulatory Visit: Payer: Self-pay | Admitting: Student

## 2020-09-14 DIAGNOSIS — M5416 Radiculopathy, lumbar region: Secondary | ICD-10-CM

## 2020-09-23 ENCOUNTER — Other Ambulatory Visit: Payer: Self-pay

## 2020-09-23 ENCOUNTER — Ambulatory Visit
Admission: RE | Admit: 2020-09-23 | Discharge: 2020-09-23 | Disposition: A | Discharge status: Home / Self Care | Payer: Medicare Other | Source: Ambulatory Visit | Attending: Student | Admitting: Student

## 2020-09-23 DIAGNOSIS — M5416 Radiculopathy, lumbar region: Secondary | ICD-10-CM

## 2020-09-23 DIAGNOSIS — M48061 Spinal stenosis, lumbar region without neurogenic claudication: Secondary | ICD-10-CM | POA: Diagnosis not present

## 2020-09-23 DIAGNOSIS — M545 Low back pain, unspecified: Secondary | ICD-10-CM | POA: Diagnosis not present

## 2020-09-26 DIAGNOSIS — M5416 Radiculopathy, lumbar region: Secondary | ICD-10-CM | POA: Diagnosis not present

## 2020-10-08 DIAGNOSIS — E118 Type 2 diabetes mellitus with unspecified complications: Secondary | ICD-10-CM | POA: Diagnosis not present

## 2020-10-08 DIAGNOSIS — E789 Disorder of lipoprotein metabolism, unspecified: Secondary | ICD-10-CM | POA: Diagnosis not present

## 2020-10-08 DIAGNOSIS — E039 Hypothyroidism, unspecified: Secondary | ICD-10-CM | POA: Diagnosis not present

## 2020-10-08 DIAGNOSIS — E119 Type 2 diabetes mellitus without complications: Secondary | ICD-10-CM | POA: Diagnosis not present

## 2020-10-08 DIAGNOSIS — I10 Essential (primary) hypertension: Secondary | ICD-10-CM | POA: Diagnosis not present

## 2020-10-08 DIAGNOSIS — R739 Hyperglycemia, unspecified: Secondary | ICD-10-CM | POA: Diagnosis not present

## 2020-10-11 DIAGNOSIS — I10 Essential (primary) hypertension: Secondary | ICD-10-CM | POA: Diagnosis not present

## 2020-10-11 DIAGNOSIS — E789 Disorder of lipoprotein metabolism, unspecified: Secondary | ICD-10-CM | POA: Diagnosis not present

## 2020-10-11 DIAGNOSIS — Z0000 Encounter for general adult medical examination without abnormal findings: Secondary | ICD-10-CM | POA: Diagnosis not present

## 2020-10-11 DIAGNOSIS — E039 Hypothyroidism, unspecified: Secondary | ICD-10-CM | POA: Diagnosis not present

## 2020-10-11 DIAGNOSIS — E1165 Type 2 diabetes mellitus with hyperglycemia: Secondary | ICD-10-CM | POA: Diagnosis not present

## 2020-10-11 DIAGNOSIS — K219 Gastro-esophageal reflux disease without esophagitis: Secondary | ICD-10-CM | POA: Diagnosis not present

## 2020-10-18 ENCOUNTER — Telehealth: Payer: Self-pay | Admitting: Cardiovascular Disease

## 2020-10-18 NOTE — Telephone Encounter (Signed)
Spoke with pt who report fluttering symptoms last night. Pt did not check HR and denies any other symptoms. Pt state she feels better today but would like to be check out.  Appointment scheduled for 2/21 at 10:45 with Doreene Adas, PA.

## 2020-10-18 NOTE — Telephone Encounter (Signed)
Patient c/o Palpitations:  High priority if patient c/o lightheadedness, shortness of breath, or chest pain  1) How long have you had palpitations/irregular HR/ Afib? Are you having the symptoms now? No. Patient had symptoms last night   2) Are you currently experiencing lightheadedness, SOB or CP? no  3) Do you have a history of afib (atrial fibrillation) or irregular heart rhythm? yes  4) Have you checked your BP or HR? (document readings if available): no  5) Are you experiencing any other symptoms? No  Patient just wanted to be checked out because she was having some irregular heartbeats last night. She took her medication this morning and it has gotten better

## 2020-10-21 NOTE — Progress Notes (Signed)
Cardiology Office Note:    Date:  10/22/2020   ID:  [name], Furno Feb 11, 1946, [name]  PCP:  Jani Gravel, MD  Cardiologist:  Shelva Majestic, MD   Referring MD: Jani Gravel, MD   Chief Complaint  Patient presents with  . Follow-up    Palpitations, preop    History of Present Illness:    Misty Newman is a 75 y.o. female with a hx of HTN, RBBB, HLD LDL goal < 70, chronic bilateral lower extremity edema, and mitral valve prolapse. She was seen in the ED 01/10/16 with palpitations found to have sinus tachycardia with RBBB and inferior T wave abnormality. She was on lopressor 100 mg BID for palpitations. She has a history of labile blood pressure. She was seen in clinic with Dr. Claiborne Billings on 08/19/18. Per his note: she was taking metoprolol tartrate 100 mg twice a day, Lanoxin 0.125 mg daily, triamterene HCT 37.5/25 mg every other day and although it was not on her medical record she has been taking ramipril 10 mg twice a day. Dr. Claiborne Billings D/C'ed digoxin and reduced her ramipril from 10 mg BID to 10 mg daily. He also started cardizem 120 mg for HR in the 90s on 100 mg lopressor BID. Repeat echo was ordered for RBBB. Echocardiogram with preserved EF, no wall motion abnormalities, but grade 2 DD. Left atrium was mildly dilated. Of note, this echo did not mention mitral valve prolapse, consistent with her 12/09/15 echo.  She did not tolerate cardizem - started for tachycardia. She did have an episode of syncope after taking three laxatives, felt to be related to hypoglycemia. No workup. She was last seen by [name] Dec 09, 2019 and was doing well at that time. Her husband died in 2018-12-09 from esophageal cancer. She does have ankle edema after standing all day, related to venous insufficiency. Leg elevation was recommended.   Of note, Dr. Claiborne Billings had discontinued her digoxin, but she kept taking it, stating other doctors told her she would need to take it for life. She has since discontinued digoxin.   She  is maintained on lopressor 100 mg BID, ramipril 10 mg daily, maxide every other day.   She called our office with reports of heart fluttering and was added to my schedule. She states she changed thyroid medication (trade to generic). Palpitations lasted about 1-2 sec and occurred 3-4 times per week. She denied dizziness or syncope with events. She states that after she made this appt, she has had no other palpitations. She does report her new PCP checked her TSH and found it to be abnormal - they are adjusting her synthroid dose. Palpitations are also worse with emotional stress.    Past Medical History:  Diagnosis Date  . Allergy    seasonal  . Anxiety   . Arthritis   . Cataract    removal bil  . Essential hypertension   . Heart murmur    a. 01/2016 Echo: EF 65-70%, no rwma, Gr1 DD, mild MR/TR, mild to mod PR, PASP 42mmHg.  Marland Kitchen History of chronic back pain   . Hyperlipidemia   . Hypothyroidism   . Osteoarthritis   . Palpitations     Past Surgical History:  Procedure Laterality Date  . ABDOMINAL HYSTERECTOMY    . ANTERIOR FUSION CERVICAL SPINE     3 cervical fusions  . APPENDECTOMY    . BREAST EXCISIONAL BIOPSY Right    benign  . CHOLECYSTECTOMY    . COLONOSCOPY    .  TONSILLECTOMY AND ADENOIDECTOMY      Current Medications: Current Meds  Medication Sig  . cholecalciferol (VITAMIN D3) 25 MCG (1000 UT) tablet Take 1,000 Units by mouth daily.  . cyclobenzaprine (FLEXERIL) 10 MG tablet Take 10 mg by mouth daily.  . diclofenac (VOLTAREN) 75 MG EC tablet Take 75 mg by mouth daily.  . fish oil-omega-3 fatty acids 1000 MG capsule Take 1 g by mouth daily.  . Lancets (ONETOUCH DELICA PLUS [id number]) MISC TEST ONCE A DAY AS DIRECTED  . lansoprazole (PREVACID) 30 MG capsule Take 30 mg by mouth daily at 12 noon.  Marland Kitchen levothyroxine (SYNTHROID, LEVOTHROID) 100 MCG tablet Take 100 mcg by mouth daily.  . metoprolol tartrate (LOPRESSOR) 100 MG tablet Take 1 tablet (100 mg total) by mouth 2  (two) times daily.  . Misc Natural Products (OSTEO BI-FLEX/5-LOXIN ADVANCED PO) Take by mouth 2 (two) times daily.  Glory Rosebush VERIO test strip USE TO TEST SUGAR 3 TIMES DAILY  . rosuvastatin (CRESTOR) 20 MG tablet Take 10 mg by mouth daily.   Marland Kitchen triamterene-hydrochlorothiazide (MAXZIDE-25) 37.5-25 MG tablet Take 1 tablet by mouth every other day.   Current Facility-Administered Medications for the 10/22/20 encounter (Office Visit) with Ledora Bottcher, PA  Medication  . 0.9 %  sodium chloride infusion  . 0.9 %  sodium chloride infusion     Allergies:   Butorphanol, Butorphanol tartrate, Cardizem [diltiazem hcl], Clarithromycin, Meperidine hcl, Naloxone, Other, Penicillins, Sulfa antibiotics, and Sulfonamide derivatives   Social History   Socioeconomic History  . Marital status: Married    Spouse name: Not on file  . Number of children: Not on file  . Years of education: Not on file  . Highest education level: Not on file  Occupational History  . Not on file  Tobacco Use  . Smoking status: Never Smoker  . Smokeless tobacco: Never Used  Substance and Sexual Activity  . Alcohol use: No  . Drug use: No  . Sexual activity: Not on file  Other Topics Concern  . Not on file  Social History Narrative  . Not on file   Social Determinants of Health   Financial Resource Strain: Not on file  Food Insecurity: Not on file  Transportation Needs: Not on file  Physical Activity: Not on file  Stress: Not on file  Social Connections: Not on file     Family History: The patient's family history includes Colon cancer (age of onset: 70) in her sister; Colon polyps in her sister. There is no history of Esophageal cancer, Pancreatic cancer, Prostate cancer, Rectal cancer, or Stomach cancer.  ROS:   Please see the history of present illness.     All other systems reviewed and are negative.  EKGs/Labs/Other Studies Reviewed:    The following studies were reviewed today:  Echo  09/23/18: Study Conclusions   - Left ventricle: The cavity size was normal. Systolic function was  normal. The estimated ejection fraction was in the range of 55%  to 60%. Wall motion was normal; there were no regional wall  motion abnormalities. Features are consistent with a pseudonormal  left ventricular filling pattern, with concomitant abnormal  relaxation and increased filling pressure (grade 2 diastolic  dysfunction).  - Left atrium: The atrium was mildly dilated.   EKG:  EKG is  ordered today.  The ekg ordered today demonstrates sinus rhythm 70 with RBBB (old)  Recent Labs: 05/16/2020: Hemoglobin 12.3; Platelets 298.0  Recent Lipid Panel    Component Value Date/Time  CHOL  07/01/2008 0612    128        ATP III CLASSIFICATION:  <200     mg/dL   Desirable  200-239  mg/dL   Borderline High  >=240    mg/dL   High   TRIG 93 07/01/2008 0612   HDL 25 (L) 07/01/2008 0612   CHOLHDL 5.1 07/01/2008 0612   VLDL 19 07/01/2008 0612   LDLCALC  07/01/2008 0612    84        Total Cholesterol/HDL:CHD Risk Coronary Heart Disease Risk Table                     Men   Women  1/2 Average Risk   3.4   3.3    Physical Exam:    VS:  BP (!) 162/86   Pulse 70   Ht 5\' 7"  (1.702 m)   Wt 173 lb 3.2 oz (78.6 kg)   BMI 27.13 kg/m     Wt Readings from Last 3 Encounters:  10/22/20 173 lb 3.2 oz (78.6 kg)  05/16/20 167 lb (75.8 kg)  11/30/19 168 lb 9.6 oz (76.5 kg)     GEN:  Well nourished, well developed in no acute distress HEENT: Normal NECK: No JVD; No carotid bruits LYMPHATICS: No lymphadenopathy CARDIAC: RRR, no murmurs, rubs, gallops RESPIRATORY:  Clear to auscultation without rales, wheezing or rhonchi  ABDOMEN: Soft, non-tender, non-distended MUSCULOSKELETAL:  No edema; No deformity  SKIN: Warm and dry NEUROLOGIC:  Alert and oriented x 3 PSYCHIATRIC:  Normal affect   ASSESSMENT:    1. Palpitation   2. Essential hypertension   3. Hyperlipidemia, unspecified  hyperlipidemia type   4. Right bundle branch block   5. Chronic diastolic heart failure ([place])   6. Preoperative clearance    PLAN:    In order of problems listed above:  Heart fluttering History of palpitations - she did not tolerate cardizem in the past - has been on lopressor 100 mg BID - EKG today shows normal sinus rhythm - likely due to TSH abnormality - will hold off on monitor right now since palpitations have resolved for the moment - description sounds consistent with PVCs - if recurrence or more often, will place zio patch - discussed PRN 25 mg lopressor and OTC Mg, she will hold off on both for now   Labile HTN - she likely has a component of white coat hypertension - no change in BP   Hyperlipidemia - continue statin - needs updated lipid profile   Chronic diastolic heart failure Recent echo with grade 2 DD   Preop clearance for colonoscopy She is primarily limited by her hip and knee pain. She is able to complete 4.0 METS. She denies chest pain. No further testing prior to colonoscopy.    Follow up in 6 months.   Medication Adjustments/Labs and Tests Ordered: Current medicines are reviewed at length with the patient today.  Concerns regarding medicines are outlined above.  Orders Placed This Encounter  Procedures  . EKG 12-Lead   No orders of the defined types were placed in this encounter.   Signed, Ledora Bottcher, [place]  10/22/2020 11:49 AM    [facility]

## 2020-10-22 ENCOUNTER — Other Ambulatory Visit: Payer: Self-pay

## 2020-10-22 ENCOUNTER — Ambulatory Visit: Payer: Medicare Other | Admitting: Physician Assistant

## 2020-10-22 ENCOUNTER — Encounter: Payer: Self-pay | Admitting: Physician Assistant

## 2020-10-22 VITALS — BP 162/86 | HR 70 | Ht 67.0 in | Wt 173.2 lb

## 2020-10-22 DIAGNOSIS — I10 Essential (primary) hypertension: Secondary | ICD-10-CM

## 2020-10-22 DIAGNOSIS — I4510 Unspecified right bundle-branch block: Secondary | ICD-10-CM | POA: Diagnosis not present

## 2020-10-22 DIAGNOSIS — E785 Hyperlipidemia, unspecified: Secondary | ICD-10-CM | POA: Diagnosis not present

## 2020-10-22 DIAGNOSIS — Z01818 Encounter for other preprocedural examination: Secondary | ICD-10-CM

## 2020-10-22 DIAGNOSIS — I5032 Chronic diastolic (congestive) heart failure: Secondary | ICD-10-CM | POA: Diagnosis not present

## 2020-10-22 DIAGNOSIS — R002 Palpitations: Secondary | ICD-10-CM | POA: Diagnosis not present

## 2020-10-22 NOTE — Patient Instructions (Signed)
Medication Instructions:  No Changes If you need a refill on your cardiac medications before your next appointment, please call your pharmacy*   Lab Work: No Labs If you have labs (blood work) drawn today and your tests are completely normal, you will receive your results only by: Marland Kitchen MyChart Message (if you have MyChart) OR . A paper copy in the mail If you have any lab test that is abnormal or we need to change your treatment, we will call you to review the results.   Testing/Procedures: No Testing   Follow-Up: At [facility], you and your health needs are our priority.  As part of our continuing mission to provide you with exceptional heart care, we have created designated Provider Care Teams.  These Care Teams include your primary Cardiologist (physician) and Advanced Practice Providers (APPs -  Physician Assistants and Nurse Practitioners) who all work together to provide you with the care you need, when you need it.  We recommend signing up for the patient portal called "MyChart".  Sign up information is provided on this After Visit Summary.  MyChart is used to connect with patients for Virtual Visits (Telemedicine).  Patients are able to view lab/test results, encounter notes, upcoming appointments, etc.  Non-urgent messages can be sent to your provider as well.   To learn more about what you can do with MyChart, go to [email or web address].    Your next appointment:   6 month(s)  The format for your next appointment:   In Person  Provider:    Fabian Sharp PA-C

## 2020-10-29 DIAGNOSIS — M170 Bilateral primary osteoarthritis of knee: Secondary | ICD-10-CM | POA: Diagnosis not present

## 2020-10-29 DIAGNOSIS — M5136 Other intervertebral disc degeneration, lumbar region: Secondary | ICD-10-CM | POA: Diagnosis not present

## 2020-10-29 DIAGNOSIS — M25552 Pain in left hip: Secondary | ICD-10-CM | POA: Diagnosis not present

## 2020-11-08 DIAGNOSIS — M2041 Other hammer toe(s) (acquired), right foot: Secondary | ICD-10-CM | POA: Diagnosis not present

## 2020-11-08 DIAGNOSIS — M79674 Pain in right toe(s): Secondary | ICD-10-CM | POA: Diagnosis not present

## 2021-01-03 DIAGNOSIS — E119 Type 2 diabetes mellitus without complications: Secondary | ICD-10-CM | POA: Diagnosis not present

## 2021-01-03 DIAGNOSIS — E789 Disorder of lipoprotein metabolism, unspecified: Secondary | ICD-10-CM | POA: Diagnosis not present

## 2021-01-03 DIAGNOSIS — E039 Hypothyroidism, unspecified: Secondary | ICD-10-CM | POA: Diagnosis not present

## 2021-01-06 DIAGNOSIS — H2513 Age-related nuclear cataract, bilateral: Secondary | ICD-10-CM | POA: Diagnosis not present

## 2021-01-06 DIAGNOSIS — H40033 Anatomical narrow angle, bilateral: Secondary | ICD-10-CM | POA: Diagnosis not present

## 2021-01-08 DIAGNOSIS — I10 Essential (primary) hypertension: Secondary | ICD-10-CM | POA: Diagnosis not present

## 2021-01-08 DIAGNOSIS — K219 Gastro-esophageal reflux disease without esophagitis: Secondary | ICD-10-CM | POA: Diagnosis not present

## 2021-01-08 DIAGNOSIS — M85851 Other specified disorders of bone density and structure, right thigh: Secondary | ICD-10-CM | POA: Diagnosis not present

## 2021-01-08 DIAGNOSIS — M85852 Other specified disorders of bone density and structure, left thigh: Secondary | ICD-10-CM | POA: Diagnosis not present

## 2021-01-08 DIAGNOSIS — E039 Hypothyroidism, unspecified: Secondary | ICD-10-CM | POA: Diagnosis not present

## 2021-01-08 DIAGNOSIS — E1165 Type 2 diabetes mellitus with hyperglycemia: Secondary | ICD-10-CM | POA: Diagnosis not present

## 2021-01-08 DIAGNOSIS — M8589 Other specified disorders of bone density and structure, multiple sites: Secondary | ICD-10-CM | POA: Diagnosis not present

## 2021-01-08 DIAGNOSIS — E7800 Pure hypercholesterolemia, unspecified: Secondary | ICD-10-CM | POA: Diagnosis not present

## 2021-01-16 ENCOUNTER — Telehealth: Payer: Self-pay | Admitting: Gastroenterology

## 2021-01-16 NOTE — Telephone Encounter (Signed)
Spoke with patient, she states that Tuesday afternoon she started feeling bad and running a fever. She reports that she took 2 ticks off of her about 2 weeks ago and went to urgent care but they did not think her diarrhea was related. Patient reports that she has been having diarrhea off and on for about a week. Denies any blood. Patient states that she took a COVID test 2 days ago and it was negative. No fever this morning. Advised patient to try Imodium OTC and we will see her tomorrow at 8:40 AM for a follow up with Dr. Loletha Carrow. Patient verbalized understanding and had no concerns at the end of the call.  Magdalene River, CMA is aware that patient was added on.

## 2021-01-17 ENCOUNTER — Ambulatory Visit: Payer: Medicare Other | Admitting: Gastroenterology

## 2021-01-17 ENCOUNTER — Other Ambulatory Visit: Payer: Self-pay | Admitting: Internal Medicine

## 2021-01-17 ENCOUNTER — Encounter: Payer: Self-pay | Admitting: Gastroenterology

## 2021-01-17 VITALS — BP 130/66 | HR 92 | Ht 67.0 in | Wt 169.0 lb

## 2021-01-17 DIAGNOSIS — Z800 Family history of malignant neoplasm of digestive organs: Secondary | ICD-10-CM | POA: Diagnosis not present

## 2021-01-17 DIAGNOSIS — R197 Diarrhea, unspecified: Secondary | ICD-10-CM

## 2021-01-17 DIAGNOSIS — Z1231 Encounter for screening mammogram for malignant neoplasm of breast: Secondary | ICD-10-CM

## 2021-01-17 MED ORDER — PLENVU 140 G PO SOLR: 140.0000 g | ORAL | 0 refills | Status: DC

## 2021-01-17 NOTE — Patient Instructions (Addendum)
If you are age 73 or older, your body mass index should be between 23-30. Your Body mass index is 26.47 kg/m. If this is out of the aforementioned range listed, please consider follow up with your Primary Care Provider.  If you are age 80 or younger, your body mass index should be between 19-25. Your Body mass index is 26.47 kg/m. If this is out of the aformentioned range listed, please consider follow up with your Primary Care Provider.   You have been scheduled for a colonoscopy. Please follow written instructions given to you at your visit today.  Please pick up your prep supplies at the pharmacy within the next 1-3 days. If you use inhalers (even only as needed), please bring them with you on the day of your procedure.  Your provider has requested that you go to the basement level for lab work before leaving today. Press "B" on the elevator. The lab is located at the first door on the left as you exit the elevator.  Due to recent changes in healthcare laws, you may see the results of your imaging and laboratory studies on MyChart before your provider has had a chance to review them.  We understand that in some cases there may be results that are confusing or concerning to you. Not all laboratory results come back in the same time frame and the provider may be waiting for multiple results in order to interpret others.  Please give [place] 48 hours in order for your provider to thoroughly review all the results before contacting the office for clarification of your results.    It was a pleasure to see you today!  Thank you for trusting me with your gastrointestinal care!

## 2021-01-17 NOTE — Progress Notes (Signed)
[name]  Chief Complaint: Abdominal pain and diarrhea  Subjective  History: I have previously seen Misty Newman for constipation with diverticulosis and intermittent bloating and crampy lower abdominal pain.  I saw her in August 2020 and again September 2021 when she became acutely ill after perhaps a foodborne illness. She called our office yesterday saying she developed abdominal pain and diarrhea about 2 weeks ago, same time that she removed 2 ticks from her body. Last colonoscopy December 2017 for history of colon cancer in her sister: No polyps, severe left-sided diverticulosis with tortuosity, making scope passage challenging and requiring pediatric colonoscope. _____________________  About 2 weeks ago Misty Newman had the abrupt onset of crampy lower abdominal pain with bloating and several loose nonbloody stools per day.  She has not been having nocturnal diarrhea.  She recalls it came on about the same time as finding 2 ticks on her torso and leg.  She has no known sick contacts, lives at home alone after the death of her husband from esophageal cancer about 2 years ago. She denies nausea or vomiting, but says she has had intermittent low-grade temperatures to 100 point 5 at night over the last 2 weeks.  No recent antibiotic use or medication changes. 1 Imodium tablet yesterday seem to help quite a lot and she is gone on a bland diet since talking to our nurse yesterday.  ROS: Cardiovascular:  no chest pain Respiratory: no dyspnea Remainder of systems negative except as above The patient's Past Medical, Family and Social History were reviewed and are on file in the EMR. Past Medical History:  Diagnosis Date  . Allergy    seasonal  . Anxiety   . Arthritis   . Cataract    removal bil  . Essential hypertension   . Heart murmur    a. 01/2016 Echo: EF 65-70%, no rwma, Gr1 DD, mild MR/TR, mild to mod PR, PASP 102mmHg.  Marland Kitchen History of chronic back pain   . Hyperlipidemia    . Hypothyroidism   . Osteoarthritis   . Palpitations    Past Surgical History:  Procedure Laterality Date  . ABDOMINAL HYSTERECTOMY    . ANTERIOR FUSION CERVICAL SPINE     3 cervical fusions  . APPENDECTOMY    . BREAST EXCISIONAL BIOPSY Right    benign  . CHOLECYSTECTOMY    . COLONOSCOPY    . TONSILLECTOMY AND ADENOIDECTOMY      Objective:  Med list reviewed  Current Outpatient Medications:  .  cholecalciferol (VITAMIN D3) 25 MCG (1000 UT) tablet, Take 1,000 Units by mouth daily., Disp: , Rfl:  .  cyclobenzaprine (FLEXERIL) 10 MG tablet, Take 10 mg by mouth daily., Disp: , Rfl:  .  diclofenac (VOLTAREN) 75 MG EC tablet, Take 75 mg by mouth daily., Disp: , Rfl:  .  fish oil-omega-3 fatty acids 1000 MG capsule, Take 1 g by mouth daily., Disp: , Rfl:  .  Lancets (ONETOUCH DELICA PLUS [id number]) MISC, TEST ONCE A DAY AS DIRECTED, Disp: , Rfl:  .  lansoprazole (PREVACID) 30 MG capsule, Take 30 mg by mouth daily at 12 noon., Disp: , Rfl:  .  levothyroxine (SYNTHROID, LEVOTHROID) 100 MCG tablet, Take 100 mcg by mouth daily., Disp: , Rfl:  .  metoprolol tartrate (LOPRESSOR) 100 MG tablet, Take 1 tablet (100 mg total) by mouth 2 (two) times daily., Disp: 30 tablet, Rfl: 5 .  Misc Natural Products (OSTEO BI-FLEX/5-LOXIN ADVANCED PO), Take by mouth 2 (two)  times daily., Disp: , Rfl:  .  ONETOUCH VERIO test strip, USE TO TEST SUGAR 3 TIMES DAILY, Disp: , Rfl:  .  PEG-KCl-NaCl-NaSulf-Na Asc-C (PLENVU) 140 g SOLR, Take 140 g by mouth as directed. Manufacturer's coupon Universal coupon code:BIN: P2366821; GROUP: [id number]; PCN: [id number]; ID: [id number]; PAY NO MORE $50, Disp: 1 each, Rfl: 0 .  ramipril (ALTACE) 10 MG capsule, Take 1 capsule (10 mg total) by mouth daily. (Patient taking differently: Take 10 mg by mouth 2 (two) times daily.), Disp: 90 capsule, Rfl: 3 .  rosuvastatin (CRESTOR) 20 MG tablet, Take 10 mg by mouth daily. , Disp: , Rfl:  .  triamterene-hydrochlorothiazide (MAXZIDE-25)  37.5-25 MG tablet, Take 1 tablet by mouth every other day., Disp: , Rfl:   Current Facility-Administered Medications:  .  0.9 %  sodium chloride infusion, 500 mL, Intravenous, Continuous, Brodie, Misty M, MD .  0.9 %  sodium chloride infusion, 500 mL, Intravenous, Continuous, Danis, Estill Cotta III, MD   Vital signs in last 24 hrs: Vitals:   01/17/21 0835  BP: 130/66  Pulse: 92   Wt Readings from Last 3 Encounters:  01/17/21 169 lb (76.7 kg)  10/22/20 173 lb 3.2 oz (78.6 kg)  05/16/20 167 lb (75.8 kg)    Physical Exam  Well-appearing.  Well-hydrated, pleasant and conversational  HEENT: sclera anicteric, oral mucosa moist without lesions  Neck: supple, no thyromegaly, JVD or lymphadenopathy  Cardiac: RRR without murmurs, S1S2 heard, no peripheral edema  Pulm: clear to auscultation bilaterally, normal RR and effort noted  Abdomen: soft, no tenderness, with active bowel sounds. No guarding or palpable hepatosplenomegaly.  Skin; warm and dry, no jaundice or rash  Labs:   ___________________________________________ Radiologic studies:   ____________________________________________ Other:   _____________________________________________ Assessment & Plan  Assessment: Encounter Diagnoses  Name Primary?  . Acute diarrhea Yes  . Family history of colon cancer    Recent onset acute diarrhea, possibly infectious.  With her underlying chronic digestive issues, she might currently have some postinfectious functional symptoms. Family history of colon cancer in sister, last colonoscopy December 2017.  Plan: GI pathogen panel  Colonoscopy.  Most likely biopsies to be taken to rule out microscopic colitis.  She was agreeable after discussion of procedure and risks.  The benefits and risks of the planned procedure were described in detail with the patient or (when appropriate) their health care proxy.  Risks were outlined as including, but not limited to, bleeding, infection,  perforation, adverse medication reaction leading to cardiac or pulmonary decompensation, pancreatitis (if ERCP).  The limitation of incomplete mucosal visualization was also discussed.  No guarantees or warranties were given.   [name] III

## 2021-01-23 ENCOUNTER — Telehealth: Payer: Self-pay | Admitting: Gastroenterology

## 2021-01-23 NOTE — Telephone Encounter (Signed)
Patient called in. States she is having loose stool and believes could be an infection in colon. Believes she needs a prescription in the mean time for her procedure 7/19

## 2021-01-23 NOTE — Telephone Encounter (Signed)
Spoke with patient, she states that her loose stools are not as bad but her  stomach starts to hurt when she has to have a bowel movement. Reports that her stool "has a smell." Patient states that she feels all around bad, advised that it is important for her to stay hydrated and make sure that she is maintaining an appropriate calorie intake. Advised that she can supplement with Boost or Ensure. Patient has not completed the recommended stool study. She states that she took her car to get serviced this morning and is not sure when she will be able to come in to pick up the kit. Advised that this is the next recommendation prior to her colonoscopy. Advised that Dr. Danis will further advise once we have the stool study results. Patient verbalized understanding and had no concerns at the end of the call.   

## 2021-01-24 ENCOUNTER — Other Ambulatory Visit: Payer: Medicare Other

## 2021-01-24 DIAGNOSIS — R197 Diarrhea, unspecified: Secondary | ICD-10-CM

## 2021-01-24 DIAGNOSIS — Z0189 Encounter for other specified special examinations: Secondary | ICD-10-CM | POA: Diagnosis not present

## 2021-01-24 DIAGNOSIS — Z800 Family history of malignant neoplasm of digestive organs: Secondary | ICD-10-CM

## 2021-01-25 ENCOUNTER — Other Ambulatory Visit: Payer: Medicare Other

## 2021-01-25 DIAGNOSIS — R197 Diarrhea, unspecified: Secondary | ICD-10-CM | POA: Diagnosis not present

## 2021-01-25 DIAGNOSIS — A09 Infectious gastroenteritis and colitis, unspecified: Secondary | ICD-10-CM | POA: Diagnosis not present

## 2021-01-25 DIAGNOSIS — Z800 Family history of malignant neoplasm of digestive organs: Secondary | ICD-10-CM

## 2021-01-29 LAB — GI PROFILE, STOOL, PCR

## 2021-02-04 ENCOUNTER — Telehealth: Payer: Self-pay | Admitting: Gastroenterology

## 2021-02-04 NOTE — Telephone Encounter (Signed)
These are the symptoms she reported at the recent office visit. I still do not think the diarrhea and abdominal pain are from a tick-borne illness.  She was offered earlier appointments for colonoscopy when at the office visit but chose that date.  If she would like to have it earlier and there are slots available, she is welcome to move up the date.  Since she remains concerned about taking imodium, we will try dicyclomine instead.    Please send a prescription for dicyclomine 10 mg Take one every 8 hours as needed for cramps and diarrhea Disp#45, RF 1

## 2021-02-04 NOTE — Telephone Encounter (Signed)
Spoke with patient, she reports that she had a low grade feer with a temp of 99 degrees today, states that she is so weak. Pt states that she is eating but a little less than normal and drinking Gatorade. Pt had 2 ticks on her and went to an urgent care to be tested for [facility] spotted fever which was negative, she is still waiting on the Lyme disease results to come back. Pt states that she has been on Doxycyline 100 mg BID for 10 days that was prescribed by urgent care provider. Pt states that she has had 3 episodes of diarrhea today, she noticed BRB once last week. Patient states that she has been dealing with this for a month. She states that she has been back on probiotics for a few days now, denies taking any fiber supplement or Miralax at this time. Patient states that she has not been taking the Imodium because she is scared that she will get constipated. Advised that if the Imodium helped she would need to resume it and can start off and see how she does with once a day. Patient's colonoscopy is scheduled for 03/19/21. Please advise on any further recommendations, thanks.

## 2021-02-04 NOTE — Telephone Encounter (Signed)
Inbound call from pt stating that she is still having diarrhea and fever. Please advise

## 2021-02-05 MED ORDER — DICYCLOMINE HCL 10 MG PO CAPS: 10.0000 mg | ORAL_CAPSULE | Freq: Three times a day (TID) | ORAL | 1 refills | Status: DC | PRN

## 2021-02-05 NOTE — Telephone Encounter (Signed)
Spoke with patient in regard to recommendations. Patient is aware that the prescription has been sent to her pharmacy. Advised patient that her prep was sent in on 5/19. Advised patient that when she goes to pick up her prescription to get her prep as well because the pharmacy will put it back. Patient's colonoscopy has been rescheduled to the next available date which is Thursday, 03/07/21 at 11:30 am. Patient is aware that she will arrive at 10:30 am with a care partner.  Reviewed instructions with patient as she updated the dates on her instructions. Patient verbalized understanding of all information and had no concerns at the end of the call.

## 2021-02-05 NOTE — Telephone Encounter (Signed)
Lm on vm for patient to return call.  Prescription for Dicyclomine sent to pharmacy on file.

## 2021-02-08 MED ORDER — DIPHENOXYLATE-ATROPINE 2.5-0.025 MG PO TABS: 1.0000 | ORAL_TABLET | Freq: Four times a day (QID) | ORAL | 1 refills | Status: AC | PRN

## 2021-02-08 NOTE — Addendum Note (Signed)
Addended by: [name] on: 02/08/2021 11:39 AM   Modules accepted: Orders

## 2021-02-08 NOTE — Telephone Encounter (Addendum)
Patient has been notified and aware to give the new plan a try.  She will update if symptoms do not resolve or worsen

## 2021-02-08 NOTE — Telephone Encounter (Signed)
Patient called said she is still having diarrhea and it is not stopping requesting to speak with a nurse.

## 2021-02-08 NOTE — Telephone Encounter (Signed)
Patient came to the office. She drove here from [place], she complaints the diarrhea is not slowing down and its getting worse, she had 8 urgent explosive watery stools yesterday. They have re started today staring at 6am.  She has taken a Bentyl to help with some of the abdominal pain and cramps she is experiencing, she is convinced that Bentyl is not helping, but in return making her stools worse. She has informed me that she tried and failed Imodium, se states it did help but not now. She thinks she should go to the ER or Urgent care but is terrified of catching COVID while she is there. She feels like she might be dehydrated despite drinking Gatorade. I did encourage her to go if she feels that bad. Please advise

## 2021-02-08 NOTE — Telephone Encounter (Signed)
I understand that she is having great difficulty with this diarrhea.  We do not yet have a diagnosis, which is why she is having the colonoscopy.  We recently moved her up to the next available slot on July 7th.  All scope slots with all physicians full until then.  I am in procedures this morning and have a full clinic all afternoon, so I am afraid I cannot see her today.  My advice is as follows:  Stop the dicyclomine if it is not helping.  Instead of imodium, use Lomotil 1-2 tablets up to 4 times daily. I will send a prescription for it to her pharmacy.  Pepto bismol 2 tablets up to three times daily might help as well.  Be advised it will turn stool black.  I will leave the decision to her on whether or not she feels the need to go to urgent care or the ED if she is concerned about dehydration.  - HD

## 2021-02-12 ENCOUNTER — Other Ambulatory Visit: Payer: Self-pay | Admitting: Gastroenterology

## 2021-03-02 ENCOUNTER — Other Ambulatory Visit: Payer: Self-pay | Admitting: Gastroenterology

## 2021-03-05 ENCOUNTER — Telehealth: Payer: Self-pay | Admitting: Gastroenterology

## 2021-03-05 NOTE — Telephone Encounter (Signed)
Left message to return call 

## 2021-03-05 NOTE — Telephone Encounter (Signed)
Patient calling with question/concerns regarding prep.. Plz advise  thanks

## 2021-03-06 NOTE — Telephone Encounter (Signed)
Spoke to the patient. All questions have been answered.

## 2021-03-06 NOTE — Telephone Encounter (Signed)
Inbound call from patient returning your call; wants to know if she can start prep a little earlier than 6pm.  Please advise.

## 2021-03-07 ENCOUNTER — Encounter: Payer: Self-pay | Admitting: Gastroenterology

## 2021-03-07 ENCOUNTER — Ambulatory Visit (AMBULATORY_SURGERY_CENTER): Payer: Medicare Other | Admitting: Gastroenterology

## 2021-03-07 ENCOUNTER — Other Ambulatory Visit: Payer: Self-pay

## 2021-03-07 VITALS — BP 118/71 | HR 68 | Temp 97.1°F | Resp 13 | Ht 67.0 in | Wt 169.0 lb

## 2021-03-07 DIAGNOSIS — K639 Disease of intestine, unspecified: Secondary | ICD-10-CM

## 2021-03-07 DIAGNOSIS — K52831 Collagenous colitis: Secondary | ICD-10-CM | POA: Diagnosis not present

## 2021-03-07 DIAGNOSIS — R197 Diarrhea, unspecified: Secondary | ICD-10-CM

## 2021-03-07 DIAGNOSIS — Z800 Family history of malignant neoplasm of digestive organs: Secondary | ICD-10-CM

## 2021-03-07 MED ORDER — SODIUM CHLORIDE 0.9 % IV SOLN: 500.0000 mL | Freq: Once | INTRAVENOUS | Status: DC

## 2021-03-07 NOTE — Op Note (Signed)
[facility] Patient Name: Misty Newman Procedure Date: 03/07/2021 11:10 AM MRN: [id number] Endoscopist: Mallie Mussel L. Loletha Carrow , MD Age: 75 Referring MD:  Date of Birth: 11-Dec-1945 Gender: Female Account #: [id number] Procedure:                Colonoscopy Indications:              Clinically significant diarrhea of unexplained                            origin                           (see recent office note for clinical details. acute                            onset, lasted several weeks, since resolved.                            negative GI pathogen panel) Medicines:                Monitored Anesthesia Care Procedure:                Pre-Anesthesia Assessment:                           - Prior to the procedure, a History and Physical                            was performed, and patient medications and                            allergies were reviewed. The patient's tolerance of                            previous anesthesia was also reviewed. The risks                            and benefits of the procedure and the sedation                            options and risks were discussed with the patient.                            All questions were answered, and informed consent                            was obtained. Prior Anticoagulants: The patient has                            taken no previous anticoagulant or antiplatelet                            agents. ASA Grade Assessment: II - A patient with  mild systemic disease. After reviewing the risks                            and benefits, the patient was deemed in                            satisfactory condition to undergo the procedure.                           After obtaining informed consent, the colonoscope                            was passed under direct vision. Throughout the                            procedure, the patient's blood pressure, pulse, and                            oxygen  saturations were monitored continuously. The                            Olympus [id number]-[id number] (#[id number]) Colonoscope was                            introduced through the anus and advanced to the the                            cecum, identified by appendiceal orifice and                            ileocecal valve. The colonoscopy was somewhat                            difficult due to multiple diverticula in the colon                            and a tortuous colon. Successful completion of the                            procedure was aided by changing the patient to a                            supine position and using manual pressure. Scope In: 11:25:24 AM Scope Out: 01:77:93 AM Scope Withdrawal Time: 0 hours 16 minutes 57 seconds  Total Procedure Duration: 0 hours 22 minutes 47 seconds  Findings:                 The perianal and digital rectal examinations were                            normal.                           Normal mucosa was found in the rectum, in the  sigmoid colon, in the proximal transverse colon and                            in the ascending colon. Biopsies for histology were                            taken with a cold forceps from the ascending colon,                            right transverse colon and sigmoid colon for                            evaluation of microscopic colitis.                           A localized area of abnormal nodular mucosa was                            found at the distal transverse/ splenic flexure.                            See photos - approx.15 cm length of 28mm wide strip                            of raised mucosa and a few diminutive erosions.                            Adjacent tissue appeared tethered, as if fibrosis.                            (? residual effect of prior infectious or ischemic                            colitis?) Biopsies were taken with a cold forceps                            for  histology.                           Multiple diverticula were found in the sigmoid                            colon.                           The recto-sigmoid colon was significantly tortuous.                           The exam was otherwise without abnormality on                            direct and retroflexion views. Complications:            No immediate complications. Estimated Blood Loss:     Estimated blood loss  was minimal. Impression:               - Normal mucosa in the rectum, in the sigmoid                            colon, in the proximal transverse colon and in the                            ascending colon. Biopsied.                           - Nodular mucosa at the splenic flexure. Biopsied.                           - Diverticulosis in the sigmoid colon.                           - Tortuous colon.                           - The examination was otherwise normal on direct                            and retroflexion views.                           Overall clinical picture c/w protracted but                            ultimately self-limited post-infectious diarrhea. Recommendation:           - Patient has a contact number available for                            emergencies. The signs and symptoms of potential                            delayed complications were discussed with the                            patient. Return to normal activities tomorrow.                            Written discharge instructions were provided to the                            patient.                           - Resume previous diet.                           - Continue present medications.                           - Repeat colonoscopy date to be determined after  pending pathology results are reviewed for                            screening purposes. No longer than 5 years due to                            history of colon cancer in sister (Dx > age  46) Ann[place] L. Loletha Carrow, MD 03/07/2021 11:59:55 AM This report has been signed electronically.

## 2021-03-07 NOTE — Progress Notes (Signed)
Called to room to assist during endoscopic procedure.  Patient ID and intended procedure confirmed with present staff. Received instructions for my participation in the procedure from the performing physician.  

## 2021-03-07 NOTE — Progress Notes (Signed)
VS-CW  Pt's states no medical or surgical changes since previsit or office visit.  

## 2021-03-07 NOTE — Progress Notes (Signed)
PT taken to PACU. Monitors in place. VSS. Report given to RN. 

## 2021-03-07 NOTE — Patient Instructions (Signed)
HANDOUTS PROVIDED ON: DIVERTICULOSIS  The biopsies taken today have been sent for pathology.  The results can take 1-3 weeks to receive.  When your next colonoscopy should occur will be based on the pathology results.    You may resume your previous diet and medication schedule.  Thank you for allowing [place] to care for you today!!!   YOU HAD AN ENDOSCOPIC PROCEDURE TODAY AT [facility]:   Refer to the procedure report that was given to you for any specific questions about what was found during the examination.  If the procedure report does not answer your questions, please call your gastroenterologist to clarify.  If you requested that your care partner not be given the details of your procedure findings, then the procedure report has been included in a sealed envelope for you to review at your convenience later.  YOU SHOULD EXPECT: Some feelings of bloating in the abdomen. Passage of more gas than usual.  Walking can help get rid of the air that was put into your GI tract during the procedure and reduce the bloating. If you had a lower endoscopy (such as a colonoscopy or flexible sigmoidoscopy) you may notice spotting of blood in your stool or on the toilet paper. If you underwent a bowel prep for your procedure, you may not have a normal bowel movement for a few days.  Please Note:  You might notice some irritation and congestion in your nose or some drainage.  This is from the oxygen used during your procedure.  There is no need for concern and it should clear up in a day or so.  SYMPTOMS TO REPORT IMMEDIATELY:  Following lower endoscopy (colonoscopy or flexible sigmoidoscopy):  Excessive amounts of blood in the stool  Significant tenderness or worsening of abdominal pains  Swelling of the abdomen that is new, acute  Fever of 100F or higher  For urgent or emergent issues, a gastroenterologist can be reached at any hour by calling [phone]. Do not use MyChart messaging  for urgent concerns.    DIET:  We do recommend a small meal at first, but then you may proceed to your regular diet.  Drink plenty of fluids but you should avoid alcoholic beverages for 24 hours.  ACTIVITY:  You should plan to take it easy for the rest of today and you should NOT DRIVE or use heavy machinery until tomorrow (because of the sedation medicines used during the test).    FOLLOW UP: Our staff will call the number listed on your records Monday morning between 7:15 and 8:15 am to check on you and address any questions or concerns that you may have regarding the information given to you following your procedure. If we do not reach you, we will leave a message.  We will attempt to reach you two times.  During this call, we will ask if you have developed any symptoms of COVID 19. If you develop any symptoms (ie: fever, flu-like symptoms, shortness of breath, cough etc.) before then, please call [phone].  If you test positive for Covid 19 in the 2 weeks post procedure, please call and report this information to [place].    If any biopsies were taken you will be contacted by phone or by letter within the next 1-3 weeks.  Please call us at [phone] if you have not heard about the biopsies in 3 weeks.    SIGNATURES/CONFIDENTIALITY: You and/or your care partner have signed paperwork which will be entered  into your electronic medical record.  These signatures attest to the fact that that the information above on your After Visit Summary has been reviewed and is understood.  Full responsibility of the confidentiality of this discharge information lies with you and/or your care-partner.

## 2021-03-11 ENCOUNTER — Telehealth: Payer: Self-pay

## 2021-03-11 NOTE — Telephone Encounter (Signed)
  Follow up Call-  Call back number 03/07/2021  Post procedure Call Back phone  # [phone]  Permission to leave phone message Yes  Some recent data might be hidden     Patient questions:  Do you have a fever, pain , or abdominal swelling? No. Pain Score  0 *  Have you tolerated food without any problems? Yes.    Have you been able to return to your normal activities? Yes.    Do you have any questions about your discharge instructions: Diet   No. Medications  No. Follow up visit  No.  Do you have questions or concerns about your Care? No.  Actions: * If pain score is 4 or above: No action needed, pain <4.  Have you developed a fever since your procedure? no  2.   Have you had an respiratory symptoms (SOB or cough) since your procedure? no  3.   Have you tested positive for COVID 19 since your procedure no  4.   Have you had any family members/close contacts diagnosed with the COVID 19 since your procedure?  no   If yes to any of these questions please route to Joylene John, RN and Joella Prince, RN

## 2021-03-14 ENCOUNTER — Other Ambulatory Visit: Payer: Self-pay

## 2021-03-14 MED ORDER — BUDESONIDE 3 MG PO CPEP: ORAL_CAPSULE | ORAL | 1 refills | Status: DC

## 2021-03-19 ENCOUNTER — Encounter: Payer: Medicare Other | Admitting: Gastroenterology

## 2021-04-05 ENCOUNTER — Other Ambulatory Visit: Payer: Self-pay | Admitting: Gastroenterology

## 2021-04-23 ENCOUNTER — Ambulatory Visit
Admission: RE | Admit: 2021-04-23 | Discharge: 2021-04-23 | Disposition: A | Discharge status: Home / Self Care | Payer: Medicare Other | Source: Ambulatory Visit | Attending: Internal Medicine | Admitting: Internal Medicine

## 2021-04-23 ENCOUNTER — Other Ambulatory Visit: Payer: Self-pay

## 2021-04-23 DIAGNOSIS — Z1231 Encounter for screening mammogram for malignant neoplasm of breast: Secondary | ICD-10-CM

## 2021-04-24 ENCOUNTER — Other Ambulatory Visit: Payer: Self-pay | Admitting: Gastroenterology

## 2021-05-08 ENCOUNTER — Ambulatory Visit: Payer: Medicare Other | Admitting: Gastroenterology

## 2021-05-08 ENCOUNTER — Encounter: Payer: Self-pay | Admitting: Gastroenterology

## 2021-05-08 VITALS — BP 140/78 | HR 63 | Ht 67.0 in | Wt 171.0 lb

## 2021-05-08 DIAGNOSIS — K52831 Collagenous colitis: Secondary | ICD-10-CM | POA: Diagnosis not present

## 2021-05-08 DIAGNOSIS — K529 Noninfective gastroenteritis and colitis, unspecified: Secondary | ICD-10-CM

## 2021-05-08 NOTE — Progress Notes (Signed)
[facility] Progress Note  Chief Complaint: Chronic diarrhea  Subjective  History: Misty Newman follows up after her July 7 colonoscopy.  She had severe diarrhea for couple of months leading up to that, stool testing negative.  The day of her procedure she told me that the diarrhea had resolved.  Nevertheless, I took biopsies that revealed collagenous colitis.  My nurse communicated those findings to her by phone and my recommendation to start taking budesonide 9 mg daily. She has an underlying history of abdominal bloating with alternating constipation and occasional loose stool (last office visit for that September 2021). History of colon cancer in sister.  Next colonoscopy 2027.  Misty Newman says she took the budesonide for a month and stopped.  Cost her $160 and she did not want to continue it unless absolutely necessary.  She has had some occasional bloating but feels generally well overall.  She still has occasional constipation and needs MiraLAX, no recurrence of the diarrhea since shortly before the colonoscopy.  ROS: Cardiovascular:  no chest pain Respiratory: no dyspnea  The patient's Past Medical, Family and Social History were reviewed and are on file in the EMR.  Objective:  Med list reviewed  Current Outpatient Medications:    budesonide (ENTOCORT EC) 3 MG 24 hr capsule, TAKE 3 CAPSULES (9 MG TOTAL) BY MOUTH ONCE DAILY., Disp: 90 capsule, Rfl: 1   cholecalciferol (VITAMIN D3) 25 MCG (1000 UT) tablet, Take 1,000 Units by mouth daily., Disp: , Rfl:    cyclobenzaprine (FLEXERIL) 10 MG tablet, Take 10 mg by mouth daily., Disp: , Rfl:    diclofenac (VOLTAREN) 75 MG EC tablet, Take 75 mg by mouth daily., Disp: , Rfl:    dicyclomine (BENTYL) 10 MG capsule, TAKE 1 CAPSULE (10 MG TOTAL) BY MOUTH EVERY 8 (EIGHT) HOURS AS NEEDED (CRAMPS AND DIARRHEA)., Disp: 270 capsule, Rfl: 1   diphenoxylate-atropine (LOMOTIL) 2.5-0.025 MG tablet, Take 1 tablet by mouth 4 (four) times daily as needed  for diarrhea or loose stools., Disp: 30 tablet, Rfl: 1   fish oil-omega-3 fatty acids 1000 MG capsule, Take 1 g by mouth daily., Disp: , Rfl:    Lancets (ONETOUCH DELICA PLUS 123XX123) MISC, TEST ONCE A DAY AS DIRECTED, Disp: , Rfl:    lansoprazole (PREVACID) 30 MG capsule, Take 30 mg by mouth daily at 12 noon., Disp: , Rfl:    levothyroxine (SYNTHROID, LEVOTHROID) 100 MCG tablet, Take 100 mcg by mouth daily., Disp: , Rfl:    metoprolol tartrate (LOPRESSOR) 100 MG tablet, Take 1 tablet (100 mg total) by mouth 2 (two) times daily., Disp: 30 tablet, Rfl: 5   Misc Natural Products (OSTEO BI-FLEX/5-LOXIN ADVANCED PO), Take by mouth 2 (two) times daily., Disp: , Rfl:    ONETOUCH VERIO test strip, USE TO TEST SUGAR 3 TIMES DAILY, Disp: , Rfl:    ramipril (ALTACE) 5 MG capsule, Take 5 mg by mouth daily., Disp: , Rfl:    rosuvastatin (CRESTOR) 20 MG tablet, Take 10 mg by mouth daily. , Disp: , Rfl:    triamterene-hydrochlorothiazide (MAXZIDE-25) 37.5-25 MG tablet, Take 1 tablet by mouth every other day., Disp: , Rfl:    Vital signs in last 24 hrs: Vitals:   05/08/21 1356  BP: 140/78  Pulse: 63   Wt Readings from Last 3 Encounters:  05/08/21 171 lb (77.6 kg)  03/07/21 169 lb (76.7 kg)  01/17/21 169 lb (76.7 kg)    Physical Exam  Well-appearing  Cardiac: RRR without murmurs, S1S2 heard,  no peripheral edema Pulm: clear to auscultation bilaterally, normal RR and effort noted Abdomen: soft, no tenderness, with active bowel sounds. No guarding or palpable hepatosplenomegaly. Skin; warm and dry, no jaundice or rash  Labs:   ___________________________________________ Radiologic studies:   ____________________________________________ Other:   _____________________________________________ Assessment & Plan  Assessment: Encounter Diagnoses  Name Primary?   Chronic diarrhea Yes   Collagenous colitis    She had about 6 weeks of diarrhea and an ultimate diagnosis of collagenous  colitis, though her diarrhea had stopped shortly before the colonoscopy when that diagnosis was made.  This diagnosis can be a somewhat subjective finding on pathology, which may account for this.  Also, this is a condition of unclear cause that can cause symptoms episodically.  Either way, she does not currently have diarrhea and does not need further treatment with budesonide.  I encouraged her to contact me if diarrhea recurs lasting longer than a week, as another trial budesonide with then be warranted.  21 minutes were spent on this encounter (including chart review, history/exam, counseling/coordination of care, and documentation) > 50% of that time was spent on counseling and coordination of care.   [name] III

## 2021-05-08 NOTE — Patient Instructions (Signed)
If you are age 73 or older, your body mass index should be between 23-30. Your Body mass index is 26.78 kg/m. If this is out of the aforementioned range listed, please consider follow up with your Primary Care Provider.  If you are age 51 or younger, your body mass index should be between 19-25. Your Body mass index is 26.78 kg/m. If this is out of the aformentioned range listed, please consider follow up with your Primary Care Provider.   __________________________________________________________  The [facility] providers would like to encourage you to use [facility] to communicate with providers for non-urgent requests or questions.  Due to long hold times on the telephone, sending your provider a message by [facility] may be a faster and more efficient way to get a response.  Please allow 48 business hours for a response.  Please remember that this is for non-urgent requests.    It was a pleasure to see you today!  Thank you for trusting me with your gastrointestinal care!

## 2021-05-16 DIAGNOSIS — L239 Allergic contact dermatitis, unspecified cause: Secondary | ICD-10-CM | POA: Diagnosis not present

## 2021-05-20 ENCOUNTER — Telehealth: Payer: Self-pay | Admitting: Gastroenterology

## 2021-05-20 NOTE — Telephone Encounter (Signed)
Chart reviewed, patient with a history of collagenous colitis, colonoscopy recently She did use budesonide for about a month previously this year though it was stopped as symptoms had abated. If she remains presyncopal then she should be seen in the ER I agree with your recommendation for hydration focusing on fluids Bland diet Would try loperamide 2 mg, which can be repeated every 30 to 90 minutes up to a maximum of 16 mg in any 24-hour period Agree with Bentyl which she can use 10 to 20 mg 3 times daily as needed, this would work best for crampy discomfort  Will CC Dr. Loletha Carrow so that he can see my recommendations on his return

## 2021-05-20 NOTE — Telephone Encounter (Signed)
Spoke with patient in regards to recommendations, she is aware that we will let her know if Dr. Loletha Carrow has any other recommendations when he returns. Pt verbalized understanding and had no concerns at the end of the call.

## 2021-05-20 NOTE — Telephone Encounter (Signed)
Dr. Hilarie Fredrickson, DOD PM of 9/19  Danis patient with a history of collagenous colitis, chronic diarrhea  Spoke with patient, she reports that she had about 5-6 episodes of runny diarrhea this morning. Pt states that she also had abdominal cramping, nausea, and sweats. Pt has not checked her temperature. Denies any blood in the stool. Pt states that she was going pass out so she had to lye down. Pt has been advised that she needs to make sure that she is maintaining adequate hydration since she is having diarrhea. Pt has not taken any anti-diarrheals OTC. Advised that if she still has her Bentyl prescription she can take that in the meantime. Pt states that before this morning her stools were doing much better. Please advise, thanks.

## 2021-05-20 NOTE — Telephone Encounter (Signed)
Inbound call from patient. States she believes she have colitis. Says she have diarrhea, nausea, sweat, stomach hurting, and feel as if she is going to pass out. Best contact number [phone]

## 2021-05-21 NOTE — Telephone Encounter (Signed)
This sounds more like a stomach virus than collagenous colitis.  I agree with the plan as outlined and expect it will take a few days for things to settle out.  Call back if needed.  - HD

## 2021-05-21 NOTE — Telephone Encounter (Signed)
Spoke with patient in regards to information. Pt verbalized understanding and had no concerns at the end of the call.

## 2021-06-24 DIAGNOSIS — M9901 Segmental and somatic dysfunction of cervical region: Secondary | ICD-10-CM | POA: Diagnosis not present

## 2021-06-24 DIAGNOSIS — M9903 Segmental and somatic dysfunction of lumbar region: Secondary | ICD-10-CM | POA: Diagnosis not present

## 2021-06-24 DIAGNOSIS — M9902 Segmental and somatic dysfunction of thoracic region: Secondary | ICD-10-CM | POA: Diagnosis not present

## 2021-06-24 DIAGNOSIS — M5137 Other intervertebral disc degeneration, lumbosacral region: Secondary | ICD-10-CM | POA: Diagnosis not present

## 2021-06-25 DIAGNOSIS — L648 Other androgenic alopecia: Secondary | ICD-10-CM | POA: Diagnosis not present

## 2021-06-25 DIAGNOSIS — L301 Dyshidrosis [pompholyx]: Secondary | ICD-10-CM | POA: Diagnosis not present

## 2021-06-26 DIAGNOSIS — M9903 Segmental and somatic dysfunction of lumbar region: Secondary | ICD-10-CM | POA: Diagnosis not present

## 2021-06-26 DIAGNOSIS — M9901 Segmental and somatic dysfunction of cervical region: Secondary | ICD-10-CM | POA: Diagnosis not present

## 2021-06-26 DIAGNOSIS — M5137 Other intervertebral disc degeneration, lumbosacral region: Secondary | ICD-10-CM | POA: Diagnosis not present

## 2021-06-26 DIAGNOSIS — M9902 Segmental and somatic dysfunction of thoracic region: Secondary | ICD-10-CM | POA: Diagnosis not present

## 2021-07-04 DIAGNOSIS — I10 Essential (primary) hypertension: Secondary | ICD-10-CM | POA: Diagnosis not present

## 2021-07-04 DIAGNOSIS — M5416 Radiculopathy, lumbar region: Secondary | ICD-10-CM | POA: Diagnosis not present

## 2021-07-04 DIAGNOSIS — M545 Low back pain, unspecified: Secondary | ICD-10-CM | POA: Diagnosis not present

## 2021-08-01 ENCOUNTER — Other Ambulatory Visit: Payer: Self-pay | Admitting: Student

## 2021-08-01 DIAGNOSIS — M5416 Radiculopathy, lumbar region: Secondary | ICD-10-CM | POA: Diagnosis not present

## 2021-08-23 ENCOUNTER — Other Ambulatory Visit: Payer: Self-pay

## 2021-08-23 ENCOUNTER — Ambulatory Visit
Admission: RE | Admit: 2021-08-23 | Discharge: 2021-08-23 | Disposition: A | Discharge status: Home / Self Care | Payer: Medicare Other | Source: Ambulatory Visit | Attending: Student | Admitting: Student

## 2021-08-23 DIAGNOSIS — M5416 Radiculopathy, lumbar region: Secondary | ICD-10-CM

## 2021-08-23 DIAGNOSIS — R29898 Other symptoms and signs involving the musculoskeletal system: Secondary | ICD-10-CM | POA: Diagnosis not present

## 2021-08-23 DIAGNOSIS — R200 Anesthesia of skin: Secondary | ICD-10-CM | POA: Diagnosis not present

## 2021-08-23 DIAGNOSIS — M4126 Other idiopathic scoliosis, lumbar region: Secondary | ICD-10-CM | POA: Diagnosis not present

## 2021-08-23 DIAGNOSIS — M48061 Spinal stenosis, lumbar region without neurogenic claudication: Secondary | ICD-10-CM | POA: Diagnosis not present

## 2021-08-23 DIAGNOSIS — M2578 Osteophyte, vertebrae: Secondary | ICD-10-CM | POA: Diagnosis not present

## 2021-08-23 DIAGNOSIS — M545 Low back pain, unspecified: Secondary | ICD-10-CM | POA: Diagnosis not present

## 2021-09-23 ENCOUNTER — Other Ambulatory Visit: Payer: Self-pay

## 2021-09-23 ENCOUNTER — Ambulatory Visit: Payer: Medicare Other | Admitting: Orthopaedic Surgery

## 2021-09-23 ENCOUNTER — Encounter: Payer: Self-pay | Admitting: Orthopaedic Surgery

## 2021-09-23 DIAGNOSIS — G8929 Other chronic pain: Secondary | ICD-10-CM | POA: Diagnosis not present

## 2021-09-23 DIAGNOSIS — M1712 Unilateral primary osteoarthritis, left knee: Secondary | ICD-10-CM

## 2021-09-23 DIAGNOSIS — M25562 Pain in left knee: Secondary | ICD-10-CM | POA: Diagnosis not present

## 2021-09-23 MED ORDER — LIDOCAINE HCL 1 % IJ SOLN
3.0000 mL | INTRAMUSCULAR | Status: AC | PRN
  Administered 2021-09-23: 3 mL

## 2021-09-23 MED ORDER — METHYLPREDNISOLONE ACETATE 40 MG/ML IJ SUSP
40.0000 mg | INTRAMUSCULAR | Status: AC | PRN
Start: 2021-09-23 — End: 2021-09-23
  Administered 2021-09-23: 40 mg via INTRA_ARTICULAR

## 2021-09-23 NOTE — Progress Notes (Signed)
Office Visit Note   [name]           Date of Birth: 1945/11/26           MRN: [id number] Visit Date: 09/23/2021              Requested by: Jani Gravel, [address] [place],  [place] [address] PCP: Jani Gravel, MD   Assessment & Plan: Visit Diagnoses:  1. Chronic pain of left knee   2. Unilateral primary osteoarthritis, left knee     Plan: I was able to aspirate between 20 and 30 cc of clear fluid from her left knee and place a steroid injection in the knee which she tolerated very well.  I recommended compressive garments for her leg swelling.  She understands that the older surgical alternative for her knees would be a knee replacement.  She is not interested in knee replacement surgery yet.  All question concerns were answered and addressed.  She knows to wait at least 3 months between steroid injections.  Follow-Up Instructions: Return if symptoms worsen or fail to improve.   Orders:  Orders Placed This Encounter  Procedures   Large Joint Inj   No orders of the defined types were placed in this encounter.     Procedures: Large Joint Inj: L knee on 09/23/2021 1:03 PM Indications: diagnostic evaluation and pain Details: 22 G 1.5 in needle, superolateral approach  Arthrogram: No  Medications: 3 mL lidocaine 1 %; 40 mg methylPREDNISolone acetate 40 MG/ML Outcome: tolerated well, no immediate complications Procedure, treatment alternatives, risks and benefits explained, specific risks discussed. Consent was given by the patient. Immediately prior to procedure a time out was called to verify the correct patient, procedure, equipment, support staff and site/side marked as required. Patient was prepped and draped in the usual sterile fashion.      Clinical Data: No additional findings.   Subjective: Chief Complaint  Patient presents with   Left Knee - Pain  The patient is well-known to [place].  She is an active 76 year old female who  comes in for evaluation treatment of acute on chronic left knee pain.  She was washing her leg recently and shower on Saturday and felt a sharp pain in her knee and has had painful banding since then.  Previous x-rays show tricompartment arthritis of her left knee.  It has been over a year since we injected a steroid in the left knee.  She has had no other acute change in her medical status.  She has been dealing with sciatica and sees neurosurgery for this.  Her right knee has had issues in the past as well.  HPI  Review of Systems Today she denies any fever, chills, nausea, vomiting  Objective: Vital Signs: There were no vitals taken for this visit.  Physical Exam She is alert and orient x3 and in no acute distress Ortho Exam Examination of her left knee shows that she lacks full extension by few degrees and I can flex her past no degrees was very painful.  There is a moderate knee joint effusion.  The knee is ligamentously stable.  She does have swelling in both her feet and ankles.  I have recommended compressive garments for this. Specialty Comments:  No specialty comments available.  Imaging: No results found.   PMFS History: Patient Active Problem List   Diagnosis Date Noted   Unilateral primary osteoarthritis, right knee 01/06/2018   Palpitations    Essential  hypertension 01/27/2016   Hypothyroidism 01/27/2016   Right bundle branch block 01/27/2016   Hyperlipidemia LDL goal <70 01/27/2016   Bilateral lower extremity edema 01/27/2016   Past Medical History:  Diagnosis Date   Allergy    seasonal   Anxiety    Arthritis    Cataract    removal bil   Essential hypertension    Heart murmur    a. 01/2016 Echo: EF 65-70%, no rwma, Gr1 DD, mild MR/TR, mild to mod PR, PASP 57mmHg.   History of chronic back pain    Hyperlipidemia    Hypothyroidism    Osteoarthritis    Palpitations     Family History  Problem Relation Age of Onset   Colon polyps Sister    Colon cancer  Sister 20   Esophageal cancer Neg Hx    Pancreatic cancer Neg Hx    Prostate cancer Neg Hx    Rectal cancer Neg Hx    Stomach cancer Neg Hx     Past Surgical History:  Procedure Laterality Date   ABDOMINAL HYSTERECTOMY     ANTERIOR FUSION CERVICAL SPINE     3 cervical fusions   APPENDECTOMY     BREAST EXCISIONAL BIOPSY Right    benign   CHOLECYSTECTOMY     COLONOSCOPY     TONSILLECTOMY AND ADENOIDECTOMY     Social History   Occupational History   Not on file  Tobacco Use   Smoking status: Never   Smokeless tobacco: Never  Vaping Use   Vaping Use: Never used  Substance and Sexual Activity   Alcohol use: No   Drug use: No   Sexual activity: Not on file

## 2021-11-11 ENCOUNTER — Ambulatory Visit: Payer: Medicare Other | Admitting: Physician Assistant

## 2021-11-11 ENCOUNTER — Other Ambulatory Visit: Payer: Self-pay

## 2021-11-11 ENCOUNTER — Encounter: Payer: Self-pay | Admitting: Physician Assistant

## 2021-11-11 DIAGNOSIS — M1711 Unilateral primary osteoarthritis, right knee: Secondary | ICD-10-CM

## 2021-11-11 MED ORDER — LIDOCAINE HCL 1 % IJ SOLN
3.0000 mL | INTRAMUSCULAR | Status: AC | PRN
  Administered 2021-11-11: 3 mL

## 2021-11-11 MED ORDER — METHYLPREDNISOLONE ACETATE 40 MG/ML IJ SUSP
40.0000 mg | INTRAMUSCULAR | Status: AC | PRN
  Administered 2021-11-11: 40 mg via INTRA_ARTICULAR

## 2021-11-11 MED ORDER — LIDOCAINE HCL 2 % IJ SOLN
3.0000 mg | INTRAMUSCULAR | Status: AC | PRN
  Administered 2021-11-11: 3 mg

## 2021-11-11 NOTE — Progress Notes (Signed)
? ?  Procedure Note ? ?[name]             ?Date of Birth: 1945-12-23           ?MRN: [id number]             ?Visit Date: 11/11/2021 ?HPI: Misty Newman comes in today for right knee pain.  She has known osteoarthritis of right knee.  No known injury to the knee.  She is mainly having medial compartment pain.  Denies any fevers chills.  She is nondiabetic. ? ?Review of systems see HPI otherwise negative ? ?Physical exam general well-developed well-nourished female who ambulates without any assistive devices and a nonantalgic gait. ?Right knee good range of motion.  Tenderness along medial joint line.  No significant crepitus with range of motion.  No abnormal warmth erythema or effusion right knee. ? ?Procedures: ?Visit Diagnoses:  ?1. Unilateral primary osteoarthritis, right knee   ? ? ?Large Joint Inj: R knee on 11/11/2021 2:07 PM ?Indications: pain ?Details: 22 G 1.5 in needle, anterolateral approach ? ?Arthrogram: No ? ?Medications: 3 mL lidocaine 1 %; 40 mg methylPREDNISolone acetate 40 MG/ML; 3 mg lidocaine 2 % ?Outcome: tolerated well, no immediate complications ?Procedure, treatment alternatives, risks and benefits explained, specific risks discussed. Consent was given by the patient. Immediately prior to procedure a time out was called to verify the correct patient, procedure, equipment, support staff and site/side marked as required. Patient was prepped and draped in the usual sterile fashion.  ? ? ?Plan: She understands to wait at least 3 months between cortisone injections.  She will ice the knee tonight for 15 to 20 minutes.  Follow-up as needed.  Questions encouraged and answered at length. ? ? ?

## 2022-01-08 ENCOUNTER — Other Ambulatory Visit: Payer: Self-pay

## 2022-01-08 DIAGNOSIS — R600 Localized edema: Secondary | ICD-10-CM

## 2022-01-08 DIAGNOSIS — R209 Unspecified disturbances of skin sensation: Secondary | ICD-10-CM

## 2022-01-15 ENCOUNTER — Ambulatory Visit
Admission: RE | Admit: 2022-01-15 | Discharge: 2022-01-15 | Disposition: A | Discharge status: Home / Self Care | Payer: Medicare Other | Source: Ambulatory Visit

## 2022-01-15 ENCOUNTER — Other Ambulatory Visit: Payer: Self-pay

## 2022-01-15 DIAGNOSIS — R209 Unspecified disturbances of skin sensation: Secondary | ICD-10-CM

## 2022-01-15 DIAGNOSIS — R600 Localized edema: Secondary | ICD-10-CM

## 2022-02-12 DIAGNOSIS — E118 Type 2 diabetes mellitus with unspecified complications: Secondary | ICD-10-CM | POA: Diagnosis not present

## 2022-02-19 DIAGNOSIS — E039 Hypothyroidism, unspecified: Secondary | ICD-10-CM | POA: Diagnosis not present

## 2022-02-19 DIAGNOSIS — N1832 Chronic kidney disease, stage 3b: Secondary | ICD-10-CM | POA: Diagnosis not present

## 2022-02-19 DIAGNOSIS — R600 Localized edema: Secondary | ICD-10-CM | POA: Diagnosis not present

## 2022-02-19 DIAGNOSIS — I10 Essential (primary) hypertension: Secondary | ICD-10-CM | POA: Diagnosis not present

## 2022-02-19 DIAGNOSIS — E1159 Type 2 diabetes mellitus with other circulatory complications: Secondary | ICD-10-CM | POA: Diagnosis not present

## 2022-02-19 DIAGNOSIS — E7800 Pure hypercholesterolemia, unspecified: Secondary | ICD-10-CM | POA: Diagnosis not present

## 2022-03-06 DIAGNOSIS — I10 Essential (primary) hypertension: Secondary | ICD-10-CM | POA: Diagnosis not present

## 2022-03-06 DIAGNOSIS — E119 Type 2 diabetes mellitus without complications: Secondary | ICD-10-CM | POA: Diagnosis not present

## 2022-03-06 DIAGNOSIS — N1832 Chronic kidney disease, stage 3b: Secondary | ICD-10-CM | POA: Diagnosis not present

## 2022-03-06 DIAGNOSIS — E789 Disorder of lipoprotein metabolism, unspecified: Secondary | ICD-10-CM | POA: Diagnosis not present

## 2022-03-19 ENCOUNTER — Encounter: Payer: Self-pay | Admitting: Orthopaedic Surgery

## 2022-03-19 ENCOUNTER — Ambulatory Visit: Payer: Medicare Other | Admitting: Orthopaedic Surgery

## 2022-03-19 DIAGNOSIS — M25562 Pain in left knee: Secondary | ICD-10-CM

## 2022-03-19 DIAGNOSIS — G8929 Other chronic pain: Secondary | ICD-10-CM | POA: Diagnosis not present

## 2022-03-19 MED ORDER — METHYLPREDNISOLONE ACETATE 40 MG/ML IJ SUSP
40.0000 mg | INTRAMUSCULAR | Status: AC | PRN
  Administered 2022-03-19: 40 mg via INTRA_ARTICULAR

## 2022-03-19 MED ORDER — LIDOCAINE HCL 1 % IJ SOLN
3.0000 mL | INTRAMUSCULAR | Status: AC | PRN
  Administered 2022-03-19: 3 mL

## 2022-03-19 NOTE — Progress Notes (Signed)
Office Visit Note   [name]           Date of Birth: [birth year]           MRN: [id number] Visit Date: 03/19/2022              Requested by: Jani Gravel, [place] [address] [place],  [place] [address] PCP: Jani Gravel, MD   Assessment & Plan: Visit Diagnoses:  1. Chronic pain of left knee     Plan: I was able to aspirate about 20 cc of clear fluid from her left knee that she tolerated well.  Then placed a steroid injection the left knee without difficulty.  She does not feel like she needs an injection in her right knee today.  All questions and concerns were answered and addressed.  Follow-up is as needed.  Follow-Up Instructions: Return if symptoms worsen or fail to improve.   Orders:  Orders Placed This Encounter  Procedures   Large Joint Inj   No orders of the defined types were placed in this encounter.     Procedures: Large Joint Inj: L knee on 03/19/2022 1:55 PM Indications: diagnostic evaluation and pain Details: 22 G 1.5 in needle, superolateral approach  Arthrogram: No  Medications: 3 mL lidocaine 1 %; 40 mg methylPREDNISolone acetate 40 MG/ML Outcome: tolerated well, no immediate complications Procedure, treatment alternatives, risks and benefits explained, specific risks discussed. Consent was given by the patient. Immediately prior to procedure a time out was called to verify the correct patient, procedure, equipment, support staff and site/side marked as required. Patient was prepped and draped in the usual sterile fashion.       Clinical Data: No additional findings.   Subjective: Chief Complaint  Patient presents with   Left Knee - Pain  The patient has some only seen in the past.  She is a 76 year old female with chronic bilateral knee pain.  She was mowing the grass yesterday and she feels really like fluid has built up on her left knee today.  She would like to have an aspiration and injection.  She had injections  helped in the past and has been since January when she had her left knee injected last.  Her right knee was injected in March and she said that is doing well.  She said the swelling started a couple weeks ago.  She has had no falls or injuries.  She had been on oral anti-inflammatories but her primary care physician would like her not to take oral anti-inflammatories anymore.  HPI  Review of Systems There is currently listed no fever, chills, nausea, vomiting  Objective: Vital Signs: There were no vitals taken for this visit.  Physical Exam She is alert and orient x3 and in no acute distress Ortho Exam Examination of her left knee shows slight valgus malalignment.  There is a mild effusion with mainly pain in the back of her knee throughout the flexion and extension arc.  There is some slight patellofemoral crepitation.  The knee is ligamentously stable. Specialty Comments:  No specialty comments available.  Imaging: No results found.   PMFS History: Patient Active Problem List   Diagnosis Date Noted   Unilateral primary osteoarthritis, right knee 01/06/2018   Palpitations    Essential hypertension 01/27/2016   Hypothyroidism 01/27/2016   Right bundle branch block 01/27/2016   Hyperlipidemia LDL goal <70 01/27/2016   Bilateral lower extremity edema 01/27/2016   Past Medical History:  Diagnosis Date  Allergy    seasonal   Anxiety    Arthritis    Cataract    removal bil   Essential hypertension    Heart murmur    a. 01/2016 Echo: EF 65-70%, no rwma, Gr1 DD, mild MR/TR, mild to mod PR, PASP 29mHg.   History of chronic back pain    Hyperlipidemia    Hypothyroidism    Osteoarthritis    Palpitations     Family History  Problem Relation Age of Onset   Colon polyps Sister    Colon cancer Sister 771  Esophageal cancer Neg Hx    Pancreatic cancer Neg Hx    Prostate cancer Neg Hx    Rectal cancer Neg Hx    Stomach cancer Neg Hx     Past Surgical History:  Procedure  Laterality Date   ABDOMINAL HYSTERECTOMY     ANTERIOR FUSION CERVICAL SPINE     3 cervical fusions   APPENDECTOMY     BREAST EXCISIONAL BIOPSY Right    benign   CHOLECYSTECTOMY     COLONOSCOPY     TONSILLECTOMY AND ADENOIDECTOMY     Social History   Occupational History   Not on file  Tobacco Use   Smoking status: Never   Smokeless tobacco: Never  Vaping Use   Vaping Use: Never used  Substance and Sexual Activity   Alcohol use: No   Drug use: No   Sexual activity: Not on file

## 2022-03-24 ENCOUNTER — Ambulatory Visit: Payer: Medicare Other | Admitting: Physician Assistant

## 2022-03-25 ENCOUNTER — Other Ambulatory Visit: Payer: Self-pay | Admitting: Internal Medicine

## 2022-03-25 DIAGNOSIS — Z1231 Encounter for screening mammogram for malignant neoplasm of breast: Secondary | ICD-10-CM

## 2022-03-31 DIAGNOSIS — H10023 Other mucopurulent conjunctivitis, bilateral: Secondary | ICD-10-CM | POA: Diagnosis not present

## 2022-04-22 DIAGNOSIS — H10013 Acute follicular conjunctivitis, bilateral: Secondary | ICD-10-CM | POA: Diagnosis not present

## 2022-04-24 ENCOUNTER — Ambulatory Visit
Admission: RE | Admit: 2022-04-24 | Discharge: 2022-04-24 | Disposition: A | Discharge status: Home / Self Care | Payer: Medicare Other | Source: Ambulatory Visit | Attending: Internal Medicine | Admitting: Internal Medicine

## 2022-04-24 DIAGNOSIS — Z1231 Encounter for screening mammogram for malignant neoplasm of breast: Secondary | ICD-10-CM | POA: Diagnosis not present

## 2022-05-14 DIAGNOSIS — E1159 Type 2 diabetes mellitus with other circulatory complications: Secondary | ICD-10-CM | POA: Diagnosis not present

## 2022-05-14 DIAGNOSIS — E7800 Pure hypercholesterolemia, unspecified: Secondary | ICD-10-CM | POA: Diagnosis not present

## 2022-05-21 ENCOUNTER — Ambulatory Visit: Payer: Medicare Other | Admitting: Orthopaedic Surgery

## 2022-05-21 DIAGNOSIS — E1165 Type 2 diabetes mellitus with hyperglycemia: Secondary | ICD-10-CM | POA: Diagnosis not present

## 2022-05-21 DIAGNOSIS — J069 Acute upper respiratory infection, unspecified: Secondary | ICD-10-CM | POA: Diagnosis not present

## 2022-05-21 DIAGNOSIS — E039 Hypothyroidism, unspecified: Secondary | ICD-10-CM | POA: Diagnosis not present

## 2022-05-21 DIAGNOSIS — N1831 Chronic kidney disease, stage 3a: Secondary | ICD-10-CM | POA: Diagnosis not present

## 2022-05-21 DIAGNOSIS — E7800 Pure hypercholesterolemia, unspecified: Secondary | ICD-10-CM | POA: Diagnosis not present

## 2022-05-21 DIAGNOSIS — E789 Disorder of lipoprotein metabolism, unspecified: Secondary | ICD-10-CM | POA: Diagnosis not present

## 2022-05-21 DIAGNOSIS — I10 Essential (primary) hypertension: Secondary | ICD-10-CM | POA: Diagnosis not present

## 2022-06-02 ENCOUNTER — Ambulatory Visit: Payer: Medicare Other | Admitting: Physician Assistant

## 2022-06-02 DIAGNOSIS — M1711 Unilateral primary osteoarthritis, right knee: Secondary | ICD-10-CM | POA: Diagnosis not present

## 2022-06-02 DIAGNOSIS — M170 Bilateral primary osteoarthritis of knee: Secondary | ICD-10-CM

## 2022-06-02 DIAGNOSIS — M1712 Unilateral primary osteoarthritis, left knee: Secondary | ICD-10-CM | POA: Diagnosis not present

## 2022-06-02 MED ORDER — GABAPENTIN 300 MG PO CAPS: 300.0000 mg | ORAL_CAPSULE | Freq: Every day | ORAL | 1 refills | Status: DC

## 2022-06-02 MED ORDER — LIDOCAINE HCL 1 % IJ SOLN
3.0000 mL | INTRAMUSCULAR | Status: AC | PRN
  Administered 2022-06-02: 3 mL

## 2022-06-02 MED ORDER — METHYLPREDNISOLONE ACETATE 40 MG/ML IJ SUSP
40.0000 mg | INTRAMUSCULAR | Status: AC | PRN
  Administered 2022-06-02: 40 mg via INTRA_ARTICULAR

## 2022-06-02 NOTE — Progress Notes (Signed)
   Procedure Note  [name]             Date of Birth: 1945-12-07           MRN: [id number]             Visit Date: 06/02/2022 HPI: Ms. Hingle comes in today requesting cortisone injection in her right knee.  She is also asking that her left knee be aspirated.  She underwent a cortisone injection in her right knee on 11/11/2021 and the left knee 03/19/2022.  She has had no new injury to either knee.  She is on diclofenac orally.  She has known tricompartmental arthritis left knee and right knee she has moderate patellofemoral and moderate to severe medial compartmental narrowing.  Review of systems: Denies any fevers chills  Physical exam: General no acute distress.  Mood and affect appropriate Right knee no abnormal warmth erythema or effusion.  Left knee slight effusion.  No abnormal warmth or erythema of the left knee.  Full range of motion of the right knee left knee flexion to 90 degrees.  Full extension left knee. Procedures: Visit Diagnoses:  1. Unilateral primary osteoarthritis, right knee   2. Unilateral primary osteoarthritis, left knee     Large Joint Inj: bilateral knee on 06/02/2022 4:02 PM Indications: pain Details: 22 G 1.5 in needle, anterolateral approach  Arthrogram: No  Medications (Right): 40 mg methylPREDNISolone acetate 40 MG/ML; 3 mL lidocaine 1 % Medications (Left): 3 mL lidocaine 1 % Aspirate (Left): 23 mL yellow Outcome: tolerated well, no immediate complications Procedure, treatment alternatives, risks and benefits explained, specific risks discussed. Consent was given by the patient. Immediately prior to procedure a time out was called to verify the correct patient, procedure, equipment, support staff and site/side marked as required. Patient was prepped and draped in the usual sterile fashion.     Plan: She understands wait least 3 months between cortisone injections in her knees.  She will follow-up with [place] as needed.  Continue other oral  NSAIDs.  Questions were encouraged and answered.

## 2022-06-19 DIAGNOSIS — L308 Other specified dermatitis: Secondary | ICD-10-CM | POA: Diagnosis not present

## 2022-07-23 ENCOUNTER — Encounter: Payer: Self-pay | Admitting: Orthopaedic Surgery

## 2022-07-23 ENCOUNTER — Ambulatory Visit: Payer: Medicare Other | Admitting: Orthopaedic Surgery

## 2022-07-23 DIAGNOSIS — G8929 Other chronic pain: Secondary | ICD-10-CM

## 2022-07-23 DIAGNOSIS — M25462 Effusion, left knee: Secondary | ICD-10-CM

## 2022-07-23 DIAGNOSIS — M25562 Pain in left knee: Secondary | ICD-10-CM

## 2022-07-23 DIAGNOSIS — M1712 Unilateral primary osteoarthritis, left knee: Secondary | ICD-10-CM | POA: Diagnosis not present

## 2022-07-23 MED ORDER — LIDOCAINE HCL 1 % IJ SOLN
3.0000 mL | INTRAMUSCULAR | Status: AC | PRN
  Administered 2022-07-23: 3 mL

## 2022-07-23 NOTE — Progress Notes (Signed)
The patient comes in today with recurrent effusion of her left knee.  She has known arthritis in the left knee.  She is an active 76 year old female but does live alone.  She had an aspiration and steroid injection just last month.  She said she cannot bend her knee well and has fluid and he would like to have this aspirated today.  I was able to aspirate at least 30 cc of fluid from her knee on the left side.  I did not place a steroid back in it since its only been a month since her steroid injection.  We talked again about the possibility knee replacement surgery in the future.  She did ask about arthroscopic surgery but I explained to her that arthroscopic surgery is using warranted with an arthritic knee.  All questions concerns were answered and addressed.  Follow-up is as needed.      Procedure Note  [name]             Date of Birth: Jan 27, 1946           MRN: [id number]             Visit Date: 07/23/2022  Procedures: Visit Diagnoses:  1. Chronic pain of left knee   2. Unilateral primary osteoarthritis, left knee   3. Effusion, left knee     Large Joint Inj: L knee on 07/23/2022 4:16 PM Indications: diagnostic evaluation and pain Details: 22 G 1.5 in needle, superolateral approach  Arthrogram: No  Medications: 3 mL lidocaine 1 % Outcome: tolerated well, no immediate complications Procedure, treatment alternatives, risks and benefits explained, specific risks discussed. Consent was given by the patient. Immediately prior to procedure a time out was called to verify the correct patient, procedure, equipment, support staff and site/side marked as required. Patient was prepped and draped in the usual sterile fashion.

## 2022-08-27 ENCOUNTER — Encounter: Payer: Self-pay | Admitting: Orthopaedic Surgery

## 2022-08-27 ENCOUNTER — Ambulatory Visit: Payer: Medicare Other | Admitting: Orthopaedic Surgery

## 2022-08-27 DIAGNOSIS — M25462 Effusion, left knee: Secondary | ICD-10-CM | POA: Diagnosis not present

## 2022-08-27 DIAGNOSIS — M1712 Unilateral primary osteoarthritis, left knee: Secondary | ICD-10-CM

## 2022-08-27 DIAGNOSIS — G8929 Other chronic pain: Secondary | ICD-10-CM | POA: Diagnosis not present

## 2022-08-27 DIAGNOSIS — M25562 Pain in left knee: Secondary | ICD-10-CM

## 2022-08-27 MED ORDER — LIDOCAINE HCL 1 % IJ SOLN
3.0000 mL | INTRAMUSCULAR | Status: AC | PRN
  Administered 2022-08-27: 3 mL

## 2022-08-27 MED ORDER — METHYLPREDNISOLONE ACETATE 40 MG/ML IJ SUSP
40.0000 mg | INTRAMUSCULAR | Status: AC | PRN
  Administered 2022-08-27: 40 mg via INTRA_ARTICULAR

## 2022-08-27 NOTE — Progress Notes (Signed)
The patient comes in today with continued knee pain and swelling.  Is really not hurting her as much is just having swelling in that knee.  She does have bilateral lower extremity peripheral edema.  She has had blood flow studies before by her primary care physician.  They recommended compression socks but she has a hard time putting those on.  On examination of her right knee today there is a moderate effusion.  I did aspirate about 30 cc of clear yellow fluid from her knee without difficulty.  I placed a steroid injection in the knee as well.  I have recommended continue compression garments.  If her knee worsens in any way she knows to come see [place].       Procedure Note  [name]             Date of Birth: 05-23-1946           MRN: [id number]             Visit Date: 08/27/2022  Procedures: Visit Diagnoses:  1. Chronic pain of left knee   2. Effusion, left knee   3. Unilateral primary osteoarthritis, left knee     Large Joint Inj: L knee on 08/27/2022 2:41 PM Indications: diagnostic evaluation and pain Details: 22 G 1.5 in needle, superolateral approach  Arthrogram: No  Medications: 3 mL lidocaine 1 %; 40 mg methylPREDNISolone acetate 40 MG/ML Outcome: tolerated well, no immediate complications Procedure, treatment alternatives, risks and benefits explained, specific risks discussed. Consent was given by the patient. Immediately prior to procedure a time out was called to verify the correct patient, procedure, equipment, support staff and site/side marked as required. Patient was prepped and draped in the usual sterile fashion.

## 2022-09-25 ENCOUNTER — Telehealth: Payer: Self-pay | Admitting: Cardiovascular Disease

## 2022-09-25 DIAGNOSIS — E119 Type 2 diabetes mellitus without complications: Secondary | ICD-10-CM | POA: Diagnosis not present

## 2022-09-25 DIAGNOSIS — E039 Hypothyroidism, unspecified: Secondary | ICD-10-CM | POA: Diagnosis not present

## 2022-09-25 DIAGNOSIS — R002 Palpitations: Secondary | ICD-10-CM | POA: Diagnosis not present

## 2022-09-25 NOTE — Telephone Encounter (Signed)
Patient calling in to see if she can get her labs done. But there is no orders put in. Please advise

## 2022-09-25 NOTE — Telephone Encounter (Signed)
Spoke to patient . She states she has been having issue and last time she was in the hospital with issue her thyriod "was out of whack"  She wanted to know if she need labs for upcoming appointment.  RN asked patient who has been following her thyroid. She states  primary office but  the current provider does not follow as closely as Dr Wilson Singer. She stated has had lab work done in 2023.  RN informed patient  to contact primary for possible labs and to wait until appointment with  cariology to see if labs are needed. She voiced understanding and stated she would contact primary.

## 2022-09-29 ENCOUNTER — Ambulatory Visit: Payer: Medicare Other | Attending: Nurse Practitioner | Admitting: Nurse Practitioner

## 2022-09-29 ENCOUNTER — Encounter: Payer: Self-pay | Admitting: Nurse Practitioner

## 2022-09-29 VITALS — BP 132/70 | HR 94 | Ht 67.0 in | Wt 173.0 lb

## 2022-09-29 DIAGNOSIS — I10 Essential (primary) hypertension: Secondary | ICD-10-CM | POA: Diagnosis not present

## 2022-09-29 DIAGNOSIS — R002 Palpitations: Secondary | ICD-10-CM

## 2022-09-29 DIAGNOSIS — E039 Hypothyroidism, unspecified: Secondary | ICD-10-CM

## 2022-09-29 DIAGNOSIS — E785 Hyperlipidemia, unspecified: Secondary | ICD-10-CM

## 2022-09-29 DIAGNOSIS — I4510 Unspecified right bundle-branch block: Secondary | ICD-10-CM | POA: Diagnosis not present

## 2022-09-29 DIAGNOSIS — E119 Type 2 diabetes mellitus without complications: Secondary | ICD-10-CM

## 2022-09-29 DIAGNOSIS — I5032 Chronic diastolic (congestive) heart failure: Secondary | ICD-10-CM | POA: Diagnosis not present

## 2022-09-29 NOTE — Patient Instructions (Addendum)
Medication Instructions:  Your physician recommends that you continue on your current medications as directed. Please refer to the Current Medication list given to you today.   *If you need a refill on your cardiac medications before your next appointment, please call your pharmacy*   Lab Work: NONE ordered at this time of appointment   If you have labs (blood work) drawn today and your tests are completely normal, you will receive your results only by: [place] (if you have MyChart) OR A paper copy in the mail If you have any lab test that is abnormal or we need to change your treatment, we will call you to review the results.   Testing/Procedures: NONE ordered at this time of appointment    Follow-Up: At [facility], you and your health needs are our priority.  As part of our continuing mission to provide you with exceptional heart care, we have created designated Provider Care Teams.  These Care Teams include your primary Cardiologist (physician) and Advanced Practice Providers (APPs -  Physician Assistants and Nurse Practitioners) who all work together to provide you with the care you need, when you need it.  We recommend signing up for the patient portal called "MyChart".  Sign up information is provided on this After Visit Summary.  MyChart is used to connect with patients for Virtual Visits (Telemedicine).  Patients are able to view lab/test results, encounter notes, upcoming appointments, etc.  Non-urgent messages can be sent to your provider as well.   To learn more about what you can do with MyChart, go to [email or web address].    Your next appointment:   1 year   Provider:   Shelva Majestic, MD     Other Instructions

## 2022-09-29 NOTE — Progress Notes (Signed)
Office Visit    Patient Name: Misty Newman Date of Encounter: 09/29/2022  Primary Care Provider:  [facility], [place] Primary Cardiologist:  Shelva Majestic, MD  Chief Complaint    77 year old female with a history of RBBB, chronic diastolic heart failure, palpitations, labile hypertension, hyperlipidemia, type 2 diabetes and hypothyroidism who presents for follow-up related to heart failure and hypertension.  Past Medical History    Past Medical History:  Diagnosis Date   Allergy    seasonal   Anxiety    Arthritis    Cataract    removal bil   Essential hypertension    Heart murmur    a. 01/2016 Echo: EF 65-70%, no rwma, Gr1 DD, mild MR/TR, mild to mod PR, PASP 94mHg.   History of chronic back pain    Hyperlipidemia    Hypothyroidism    Osteoarthritis    Palpitations    Past Surgical History:  Procedure Laterality Date   ABDOMINAL HYSTERECTOMY     ANTERIOR FUSION CERVICAL SPINE     3 cervical fusions   APPENDECTOMY     BREAST EXCISIONAL BIOPSY Right    benign   CHOLECYSTECTOMY     COLONOSCOPY     TONSILLECTOMY AND ADENOIDECTOMY      Allergies  Allergies  Allergen Reactions   Butorphanol Other (See Comments)   Butorphanol Tartrate     REACTION: sweating, increased heartrate   Cardizem [Diltiazem Hcl] Other (See Comments)    Per pt cause b/p to drop low   Clarithromycin     Can't remember   Meperidine Hcl     REACTION: itching     Naloxone     Pt questions allergies, is unaware of ever having, does not sound familiar   Other Other (See Comments)   Penicillins     REACTION: unspecified- as a child   Sulfa Antibiotics    Sulfonamide Derivatives     REACTION: does not remember     Labs/Other Studies Reviewed    The following studies were reviewed today: Echo 09/23/18: Study Conclusions   - Left ventricle: The cavity size was normal. Systolic function was    normal. The estimated ejection fraction was in the range of 55%    to 60%. Wall  motion was normal; there were no regional wall    motion abnormalities. Features are consistent with a pseudonormal    left ventricular filling pattern, with concomitant abnormal    relaxation and increased filling pressure (grade 2 diastolic    dysfunction).  - Left atrium: The atrium was mildly dilated.   Recent Labs: No results found for requested labs within last 365 days.  Recent Lipid Panel    Component Value Date/Time   CHOL  07/01/2008 0612    128        ATP III CLASSIFICATION:  <200     mg/dL   Desirable  200-239  mg/dL   Borderline High  >=240    mg/dL   High   TRIG 93 07/01/2008 0612   HDL 25 (L) 07/01/2008 0612   CHOLHDL 5.1 07/01/2008 0612   VLDL 19 07/01/2008 0612   LDLCALC  07/01/2008 0612    84        Total Cholesterol/HDL:CHD Risk Coronary Heart Disease Risk Table                     Men   Women  1/2 Average Risk   3.4   3.3  History of Present Illness    77 year old female with the above past medical history including RBBB, chronic diastolic heart failure, palpitations, labile hypertension, hyperlipidemia, type 2 diabetes and hypothyroidism.  She was evaluated in the ED in 12/2015 in the setting of palpitations and was found to have sinus tachycardia with RBBB, inferior T wave abnormality.  Echocardiogram at the time showed evidence of mitral valve prolapse.  She was started on metoprolol.  Echocardiogram in 2020 showed preserved EF, no RWMA, G2 DD, no evidence of mitral valve prolapse. Additionally, she has a history of labile BP.  She was last seen in office on 10/22/2020, stable overall from a cardiac standpoint.  She did note intermittent fleeting palpitations, exacerbated by emotional stress.  She was undergoing titration of her thyroid medication at the time.  She was cleared for colonoscopy procedure at the time.  She presents today for follow-up.  Since her last visit she has been stable overall from a cardiac standpoint.  Last week she did have an  episode of palpitations that occurred during the night.  She saw her PCP who did an EKG which was unremarkable per notes, labs were stable (CMET, TSH).  She denies any palpitations since.  She denies any dizziness, presyncope, syncope, chest pain, dyspnea, edema, PND, orthopnea, weight gain. Overall, she reports feeling well.   Home Medications    Current Outpatient Medications  Medication Sig Dispense Refill   budesonide (ENTOCORT EC) 3 MG 24 hr capsule TAKE 3 CAPSULES (9 MG TOTAL) BY MOUTH ONCE DAILY. 90 capsule 1   cholecalciferol (VITAMIN D3) 25 MCG (1000 UT) tablet Take 1,000 Units by mouth daily.     cyclobenzaprine (FLEXERIL) 10 MG tablet Take 10 mg by mouth daily.     diclofenac (VOLTAREN) 75 MG EC tablet Take 75 mg by mouth daily.     fish oil-omega-3 fatty acids 1000 MG capsule Take 1 g by mouth daily.     Lancets (ONETOUCH DELICA PLUS [id number]) MISC TEST ONCE A DAY AS DIRECTED     lansoprazole (PREVACID) 30 MG capsule Take 30 mg by mouth daily at 12 noon.     levothyroxine (SYNTHROID, LEVOTHROID) 100 MCG tablet Take 100 mcg by mouth daily.     metoprolol tartrate (LOPRESSOR) 100 MG tablet Take 1 tablet (100 mg total) by mouth 2 (two) times daily. 30 tablet 5   Misc Natural Products (OSTEO BI-FLEX/5-LOXIN ADVANCED PO) Take by mouth 2 (two) times daily.     ONETOUCH VERIO test strip USE TO TEST SUGAR 3 TIMES DAILY     ramipril (ALTACE) 5 MG capsule Take 5 mg by mouth daily.     rosuvastatin (CRESTOR) 20 MG tablet Take 10 mg by mouth daily.      triamterene-hydrochlorothiazide (MAXZIDE-25) 37.5-25 MG tablet Take 1 tablet by mouth every other day.     dicyclomine (BENTYL) 10 MG capsule TAKE 1 CAPSULE (10 MG TOTAL) BY MOUTH EVERY 8 (EIGHT) HOURS AS NEEDED (CRAMPS AND DIARRHEA). (Patient not taking: Reported on 09/29/2022) 270 capsule 1   diphenoxylate-atropine (LOMOTIL) 2.5-0.025 MG tablet Take 1 tablet by mouth 4 (four) times daily as needed for diarrhea or loose stools. (Patient not  taking: Reported on 09/29/2022) 30 tablet 1   gabapentin (NEURONTIN) 300 MG capsule Take 1 capsule (300 mg total) by mouth at bedtime. (Patient not taking: Reported on 09/29/2022) 30 capsule 1   No current facility-administered medications for this visit.     Review of Systems    She denies chest  pain, dyspnea, pnd, orthopnea, n, v, dizziness, syncope, edema, weight gain, or early satiety. All other systems reviewed and are otherwise negative except as noted above.   Physical Exam    VS:  BP 132/70   Pulse 94   Ht '5\' 7"'$  (1.702 m)   Wt 173 lb (78.5 kg)   SpO2 96%   BMI 27.10 kg/m  GEN: Well nourished, well developed, in no acute distress. HEENT: normal. Neck: Supple, no JVD, carotid bruits, or masses. Cardiac: RRR, no murmurs, rubs, or gallops. No clubbing, cyanosis, edema.  Radials/DP/PT 2+ and equal bilaterally.  Respiratory:  Respirations regular and unlabored, clear to auscultation bilaterally. GI: Soft, nontender, nondistended, BS + x 4. MS: no deformity or atrophy. Skin: warm and dry, no rash. Neuro:  Strength and sensation are intact. Psych: Normal affect.  Accessory Clinical Findings    ECG personally reviewed by me today -NSR, 90 bpm, RBBB- no acute changes.   Lab Results  Component Value Date   WBC 12.0 (H) 05/16/2020   HGB 12.3 05/16/2020   HCT 36.9 05/16/2020   MCV 88.5 05/16/2020   PLT 298.0 05/16/2020   Lab Results  Component Value Date   CREATININE 0.88 02/16/2019   BUN 16 02/16/2019   NA 137 02/16/2019   K 5.0 02/16/2019   CL 99 02/16/2019   CO2 25 02/16/2019   Lab Results  Component Value Date   ALT 22 02/16/2019   AST 21 02/16/2019   ALKPHOS 96 02/16/2019   BILITOT 0.4 02/16/2019   Lab Results  Component Value Date   CHOL  07/01/2008    128        ATP III CLASSIFICATION:  <200     mg/dL   Desirable  200-239  mg/dL   Borderline High  >=240    mg/dL   High   HDL 25 (L) 07/01/2008   LDLCALC  07/01/2008    84        Total  Cholesterol/HDL:CHD Risk Coronary Heart Disease Risk Table                     Men   Women  1/2 Average Risk   3.4   3.3   TRIG 93 07/01/2008   CHOLHDL 5.1 07/01/2008    Lab Results  Component Value Date   HGBA1C  07/01/2008    5.7 (NOTE)   The ADA recommends the following therapeutic goal for glycemic   control related to Hgb A1C measurement:   Goal of Therapy:   < 7.0% Hgb A1C   Reference: American Diabetes Association: Clinical Practice   Recommendations 2008, Diabetes Care,  2008, 31:(Suppl 1).    Assessment & Plan    1. Palpitations: She has a history of palpitations controlled on metoprolol.  She did have a recent episode of palpitations that occurred at night, she saw her PCP who thought it was likely due to anxiety.  She denies any palpitations since.  Labs including TSH, BMET were stable.  No additional testing needed at this time. Discussed ED precautions.  Continue metoprolol.  2. Chronic diastolic heart failure: Echo in 2020 showed preserved EF, no RWMA, G2 DD, no evidence of mitral valve prolapse as previously reported. Euvolemic and well compensated on exam.  Continue metoprolol, ramipril, Maxide-25.  3. RBBB: Stable on EKG today.  4. Hypertension: BP well controlled. Continue current antihypertensive regimen.   5. Hyperlipidemia: LDL was 42 in 05/2022.  Monitored and managed per PCP.  Continue Crestor.  6. Type 2 diabetes: A1c was 6.9 in 05/2022. Monitored and managed per PCP.   7. Hypothyroidism: Most recent TSH was 0.38 on 09/25/2022.  Monitored and managed per PCP.  8. Disposition: Follow-up in 1 year with Dr. Claiborne Billings, sooner if needed.     Lenna Sciara, NP 09/29/2022, 2:31 PM

## 2022-10-09 DIAGNOSIS — E039 Hypothyroidism, unspecified: Secondary | ICD-10-CM | POA: Diagnosis not present

## 2022-10-09 DIAGNOSIS — R002 Palpitations: Secondary | ICD-10-CM | POA: Diagnosis not present

## 2022-10-09 DIAGNOSIS — E1165 Type 2 diabetes mellitus with hyperglycemia: Secondary | ICD-10-CM | POA: Diagnosis not present

## 2022-10-17 DIAGNOSIS — D485 Neoplasm of uncertain behavior of skin: Secondary | ICD-10-CM | POA: Diagnosis not present

## 2022-11-10 ENCOUNTER — Ambulatory Visit: Payer: Medicare Other | Admitting: Physician Assistant

## 2022-11-10 ENCOUNTER — Encounter: Payer: Self-pay | Admitting: Physician Assistant

## 2022-11-10 ENCOUNTER — Ambulatory Visit (INDEPENDENT_AMBULATORY_CARE_PROVIDER_SITE_OTHER): Payer: Medicare Other

## 2022-11-10 DIAGNOSIS — M1712 Unilateral primary osteoarthritis, left knee: Secondary | ICD-10-CM

## 2022-11-10 DIAGNOSIS — M1711 Unilateral primary osteoarthritis, right knee: Secondary | ICD-10-CM | POA: Diagnosis not present

## 2022-11-10 MED ORDER — METHYLPREDNISOLONE ACETATE 40 MG/ML IJ SUSP
40.0000 mg | INTRAMUSCULAR | Status: AC | PRN
  Administered 2022-11-10: 40 mg via INTRA_ARTICULAR

## 2022-11-10 MED ORDER — LIDOCAINE HCL 1 % IJ SOLN
5.0000 mL | INTRAMUSCULAR | Status: AC | PRN
  Administered 2022-11-10: 5 mL

## 2022-11-10 MED ORDER — LIDOCAINE HCL 1 % IJ SOLN
3.0000 mL | INTRAMUSCULAR | Status: AC | PRN
  Administered 2022-11-10: 3 mL

## 2022-11-10 NOTE — Progress Notes (Signed)
                Procedure Note  [name]             Date of Birth: 06-20-1946           MRN: [id number]             Visit Date: 11/10/2022 HPI: Ms. Buick comes in today due to bilateral knee pain.  She is last given an injection left knee on 08/27/2022.  She states this past Friday she had a fall on both knees when she missed a step.  She sustained no other injuries.  She is able to ambulate without any assistive device.  She reports that she took some diclofenac once 75 mg orally and this helped quite a bit with her knee pain.  She has not however been told not to take NSAIDs by her primary care physician.  She is prediabetic.  She has known tricompartmental arthritis of both knees.  Review of systems: Denies any fevers chills.  Physical exam: General well-developed well-nourished female no acute distress ambulates without any assistive device.  Bilateral knees: Good range of motion both knees she is able to do straight leg raise bilaterally.  No significant ecchymosis either knee.  No rashes skin lesions ulcerations or impending ulcers.  Positive effusion both knees.  Tenderness medial joint line of both knees.  No instability valgus varus stressing of either knee.  Radiographs: Left knee: No acute fractures.  Knee is well located. Medial compartment is slightly more narrowed prior films.   Right knee 2 views: Knee is well located.  Tricompartmental changes. Tricompartmental arthritic changes remain unchanged from prior films.  Procedures: Visit Diagnoses:  1. Unilateral primary osteoarthritis, left knee   2. Unilateral primary osteoarthritis, right knee     Large Joint Inj: bilateral knee on 11/10/2022 11:16 AM Indications: pain Details: 22 G 1.5 in needle, superolateral approach  Arthrogram: No  Medications (Right): 5 mL lidocaine 1 %; 40 mg methylPREDNISolone acetate 40 MG/ML Aspirate (Right): 16 mL yellow Medications (Left): 3 mL lidocaine 1  % Aspirate (Left): 35 mL yellow and blood-tinged Outcome: tolerated well, no immediate complications Procedure, treatment alternatives, risks and benefits explained, specific risks discussed. Consent was given by the patient. Immediately prior to procedure a time out was called to verify the correct patient, procedure, equipment, support staff and site/side marked as required. Patient was prepped and draped in the usual sterile fashion.     Plan: Both knees were wrapped with Ace bandages she will remove these this evening before going to bed.  She knows to wait least 3 months between injections.  She would be able to have her left knee cortisone injection in approximately 3 weeks.  Follow-up as needed.

## 2022-12-01 DIAGNOSIS — J302 Other seasonal allergic rhinitis: Secondary | ICD-10-CM | POA: Diagnosis not present

## 2022-12-01 DIAGNOSIS — E1159 Type 2 diabetes mellitus with other circulatory complications: Secondary | ICD-10-CM | POA: Diagnosis not present

## 2022-12-01 DIAGNOSIS — E039 Hypothyroidism, unspecified: Secondary | ICD-10-CM | POA: Diagnosis not present

## 2022-12-29 ENCOUNTER — Ambulatory Visit: Payer: Medicare Other | Admitting: Orthopaedic Surgery

## 2022-12-29 ENCOUNTER — Telehealth: Payer: Self-pay

## 2022-12-29 DIAGNOSIS — M1711 Unilateral primary osteoarthritis, right knee: Secondary | ICD-10-CM

## 2022-12-29 DIAGNOSIS — M25562 Pain in left knee: Secondary | ICD-10-CM | POA: Diagnosis not present

## 2022-12-29 DIAGNOSIS — G8929 Other chronic pain: Secondary | ICD-10-CM

## 2022-12-29 DIAGNOSIS — M1712 Unilateral primary osteoarthritis, left knee: Secondary | ICD-10-CM

## 2022-12-29 DIAGNOSIS — M25561 Pain in right knee: Secondary | ICD-10-CM | POA: Diagnosis not present

## 2022-12-29 NOTE — Telephone Encounter (Signed)
Bilateral knee gel injections 

## 2022-12-29 NOTE — Progress Notes (Signed)
The patient is well-known to me.  She has chronic bilateral knee pain and known arthritis in both knees.  She last came in a month ago due to a fall and x-ray showed no fracture.  She had an aspiration of both knees and a steroid injection in the right knee since it was too early to place a steroid in her left knee.  She is 77 years old.  She has recently gotten a puppy as well.  She says the knees are still hurting and the injections have not really helped.  Both knees have mild effusion with slight flexion contractures but good flexion.  She does lacks full extension by few degrees.  We talked in length in detail about the possibility of knee replacement surgery.  She would at least like to try hyaluronic acid to see if that would help with her knees and I think this is reasonable to order this for both knees given the failure of conservative treatment including the failure of steroid injections.  We will see her back to hopefully place those in both knees in the near future.  This patient is diagnosed with osteoarthritis of the knee(s).    Radiographs show evidence of joint space narrowing, osteophytes, subchondral sclerosis and/or subchondral cysts.  This patient has knee pain which interferes with functional and activities of daily living.    This patient has experienced inadequate response, adverse effects and/or intolerance with conservative treatments such as acetaminophen, NSAIDS, topical creams, physical therapy or regular exercise, knee bracing and/or weight loss.   This patient has experienced inadequate response or has a contraindication to intra articular steroid injections for at least 3 months.   This patient is not scheduled to have a total knee replacement within 6 months of starting treatment with viscosupplementation.

## 2022-12-30 NOTE — Telephone Encounter (Signed)
VOB submitted for Durolane, bilateral knee  

## 2022-12-31 ENCOUNTER — Encounter: Payer: Self-pay | Admitting: Physician Assistant

## 2022-12-31 ENCOUNTER — Ambulatory Visit (INDEPENDENT_AMBULATORY_CARE_PROVIDER_SITE_OTHER): Payer: Medicare Other | Admitting: Physician Assistant

## 2022-12-31 ENCOUNTER — Other Ambulatory Visit: Payer: Self-pay

## 2022-12-31 DIAGNOSIS — M1711 Unilateral primary osteoarthritis, right knee: Secondary | ICD-10-CM

## 2022-12-31 DIAGNOSIS — M1712 Unilateral primary osteoarthritis, left knee: Secondary | ICD-10-CM

## 2022-12-31 MED ORDER — SODIUM HYALURONATE 60 MG/3ML IX PRSY
60.0000 mg | PREFILLED_SYRINGE | INTRA_ARTICULAR | Status: AC | PRN
Start: 2022-12-31 — End: 2022-12-31
  Administered 2022-12-31: 60 mg via INTRA_ARTICULAR

## 2022-12-31 MED ORDER — LIDOCAINE HCL 1 % IJ SOLN
3.0000 mL | INTRAMUSCULAR | Status: AC | PRN
Start: 2022-12-31 — End: 2022-12-31
  Administered 2022-12-31: 3 mL

## 2022-12-31 NOTE — Progress Notes (Signed)
Office Visit Note   [name]           Date of Birth: Jan 15, 1946           MRN: [id number] Visit Date: 12/31/2022              Requested by: Misty Sanger, FNP [address] [place],  [place] [address] PCP: Misty Sanger, FNP  Chief Complaint  Patient presents with  . Right Knee - Follow-up  . Left Knee - Follow-up      HPI: Misty Newman is a pleasant 77 year old woman who is a patient of Dr. Magnus Newman.  She has a history of osteoarthritis of both of her knees.  Also has a history of effusions.  She saw Dr. Magnus Newman a couple days ago.  He had a discussion with her felt that viscosupplementation would maybe help her more as the steroids did not seem to be doing anything.  She like to try this before considering a knee replacement.  She comes in today saying that her knees feel full of fluid.  Assessment & Plan: Visit Diagnoses: Osteoarthritis bilateral knees  Plan: She has been approved for Durolane injections for both of her knees.  She did drive herself here today and has had some difficulties with lightheadedness when she has had both of her knees aspirated and injected it once.  We elected to go forward and just inject her right knee today.  She will follow-up for the left knee next week.  Follow-Up Instructions: Return if symptoms worsen or fail to improve.   Ortho Exam  Patient is alert, oriented, no adenopathy, well-dressed, normal affect, normal respiratory effort. Bilateral knees she has mild to moderate effusions bilaterally but no erythema compartments are soft and nontender.  She is neurovascular intact no Misty Newman' sign  Imaging: No results found. No images are attached to the encounter.  Labs: Lab Results  Component Value Date   HGBA1C  07/01/2008    5.7 (NOTE)   The ADA recommends the following therapeutic goal for glycemic   control related to Hgb A1C measurement:   Goal of Therapy:   < 7.0% Hgb A1C   Reference: American  Diabetes Association: Clinical Practice   Recommendations 2008, Diabetes Care,  2008, 31:(Suppl 1).     Lab Results  Component Value Date   ALBUMIN 4.6 02/16/2019   ALBUMIN 4.0 06/30/2008    Lab Results  Component Value Date   MG 1.8 04/28/2018   MG 2.1 07/01/2008   MG 2.1 06/30/2008   No results found for: "VD25OH"  No results found for: "PREALBUMIN"    Latest Ref Rng & Units 05/16/2020    2:49 PM 01/10/2016    6:22 AM 07/01/2008    6:00 AM  CBC EXTENDED  WBC 4.0 - 10.5 K/uL 12.0  11.3  8.9   RBC 3.87 - 5.11 Mil/uL 4.17  3.96  4.23   Hemoglobin 12.0 - 15.0 g/dL 19.1  47.8  29.5   HCT 36.0 - 46.0 % 36.9  35.4  38.1   Platelets 150.0 - 400.0 K/uL 298.0  267  247   NEUT# 1.4 - 7.7 K/uL 7.8  8.8    Lymph# 0.7 - 4.0 K/uL 3.0  1.9       There is no height or weight on file to calculate BMI.  Orders:  No orders of the defined types were placed in this encounter.  No orders of the defined types were placed in this  encounter.    Procedures: Large Joint Inj on 12/31/2022 10:35 AM Indications: pain and diagnostic evaluation Details: 22 G 1.5 in needle, superolateral approach  Arthrogram: No  Medications: 3 mL lidocaine 1 %; 60 mg Sodium Hyaluronate 60 MG/3ML Aspirate: yellow Outcome: tolerated well, no immediate complications  After verbal consent was obtained the superior lateral portion of her right knee was prepped with alcohol and Betadine.  3 cc of lidocaine plain was injected.  After adequate analgesia an 18-gauge needle was inserted on a 25 cc syringe.  A total of 30 cc of clear yellow aspirate was aspirated.  She was then injected with a Durolane.  Band-Aid was placed she self ambulated from the clinic Procedure, treatment alternatives, risks and benefits explained, specific risks discussed. Consent was given by the patient.    Clinical Data: No additional findings.  ROS:  All other systems negative, except as noted in the HPI. Review of  Systems  Objective: Vital Signs: There were no vitals taken for this visit.  Specialty Comments:  No specialty comments available.  PMFS History: Patient Active Problem List   Diagnosis Date Noted  . Unilateral primary osteoarthritis, right knee 01/06/2018  . Palpitations   . Essential hypertension 01/27/2016  . Hypothyroidism 01/27/2016  . Right bundle branch block 01/27/2016  . Hyperlipidemia LDL goal <70 01/27/2016  . Bilateral lower extremity edema 01/27/2016   Past Medical History:  Diagnosis Date  . Allergy    seasonal  . Anxiety   . Arthritis   . Cataract    removal bil  . Essential hypertension   . Heart murmur    a. 01/2016 Echo: EF 65-70%, no rwma, Gr1 DD, mild MR/TR, mild to mod PR, PASP .  Marland Kitchen History of chronic back pain   . Hyperlipidemia   . Hypothyroidism   . Osteoarthritis   . Palpitations     Family History  Problem Relation Age of Onset  . Colon polyps Sister   . Colon cancer Sister 59  . Esophageal cancer Neg Hx   . Pancreatic cancer Neg Hx   . Prostate cancer Neg Hx   . Rectal cancer Neg Hx   . Stomach cancer Neg Hx     Past Surgical History:  Procedure Laterality Date  . ABDOMINAL HYSTERECTOMY    . ANTERIOR FUSION CERVICAL SPINE     3 cervical fusions  . APPENDECTOMY    . BREAST EXCISIONAL BIOPSY Right    benign  . CHOLECYSTECTOMY    . COLONOSCOPY    . TONSILLECTOMY AND ADENOIDECTOMY     Social History   Occupational History  . Not on file  Tobacco Use  . Smoking status: Never  . Smokeless tobacco: Never  Vaping Use  . Vaping Use: Never used  Substance and Sexual Activity  . Alcohol use: No  . Drug use: No  . Sexual activity: Not on file

## 2023-01-07 ENCOUNTER — Ambulatory Visit: Payer: Medicare Other | Admitting: Physician Assistant

## 2023-01-12 DIAGNOSIS — I10 Essential (primary) hypertension: Secondary | ICD-10-CM | POA: Diagnosis not present

## 2023-01-12 DIAGNOSIS — E1165 Type 2 diabetes mellitus with hyperglycemia: Secondary | ICD-10-CM | POA: Diagnosis not present

## 2023-01-19 DIAGNOSIS — G629 Polyneuropathy, unspecified: Secondary | ICD-10-CM | POA: Diagnosis not present

## 2023-01-19 DIAGNOSIS — L84 Corns and callosities: Secondary | ICD-10-CM | POA: Diagnosis not present

## 2023-01-19 DIAGNOSIS — I10 Essential (primary) hypertension: Secondary | ICD-10-CM | POA: Diagnosis not present

## 2023-01-19 DIAGNOSIS — E039 Hypothyroidism, unspecified: Secondary | ICD-10-CM | POA: Diagnosis not present

## 2023-01-19 DIAGNOSIS — E1165 Type 2 diabetes mellitus with hyperglycemia: Secondary | ICD-10-CM | POA: Diagnosis not present

## 2023-01-19 DIAGNOSIS — E7800 Pure hypercholesterolemia, unspecified: Secondary | ICD-10-CM | POA: Diagnosis not present

## 2023-01-30 ENCOUNTER — Ambulatory Visit: Payer: Medicare Other | Admitting: Podiatry

## 2023-01-30 DIAGNOSIS — B353 Tinea pedis: Secondary | ICD-10-CM

## 2023-01-30 MED ORDER — CLOTRIMAZOLE-BETAMETHASONE 1-0.05 % EX CREA: 1.0000 | TOPICAL_CREAM | Freq: Every day | CUTANEOUS | 0 refills | Status: AC

## 2023-01-30 NOTE — Progress Notes (Unsigned)
Subjective:  Patient ID: Misty Newman, female    DOB: [birth year],  MRN: [id number]  Chief Complaint  Patient presents with   Callouses    77 y.o. female presents with the above complaint.  Patient presents with primary complaint of right first metatarsophalangeal joint skin fissure with mild to moderate dryness secondary complaint of right foot generalized epidermolysis with subjective component of itching likely consistent with athlete's foot.  She wanted get it evaluated she has not seen anyone else prior to seeing me she denies any other acute complaints.  Does not cause her pain just some itchiness and skin peeling and dryness.  She is tried over-the-counter options which has not helped   Review of Systems: Negative except as noted in the HPI. Denies N/V/F/Ch.  Past Medical History:  Diagnosis Date   Allergy    seasonal   Anxiety    Arthritis    Cataract    removal bil   Essential hypertension    Heart murmur    a. 01/2016 Echo: EF 65-70%, no rwma, Gr1 DD, mild MR/TR, mild to mod PR, PASP .   History of chronic back pain    Hyperlipidemia    Hypothyroidism    Osteoarthritis    Palpitations     Current Outpatient Medications:    clotrimazole-betamethasone (LOTRISONE) cream, Apply 1 Application topically daily., Disp: 30 g, Rfl: 0   budesonide (ENTOCORT EC) 3 MG 24 hr capsule, TAKE 3 CAPSULES (9 MG TOTAL) BY MOUTH ONCE DAILY., Disp: 90 capsule, Rfl: 1   cholecalciferol (VITAMIN D3) 25 MCG (1000 UT) tablet, Take 1,000 Units by mouth daily., Disp: , Rfl:    cyclobenzaprine (FLEXERIL) 10 MG tablet, Take 10 mg by mouth daily., Disp: , Rfl:    diclofenac (VOLTAREN) 75 MG EC tablet, Take 75 mg by mouth daily., Disp: , Rfl:    dicyclomine (BENTYL) 10 MG capsule, TAKE 1 CAPSULE (10 MG TOTAL) BY MOUTH EVERY 8 (EIGHT) HOURS AS NEEDED (CRAMPS AND DIARRHEA). (Patient not taking: Reported on 09/29/2022), Disp: 270 capsule, Rfl: 1   diphenoxylate-atropine (LOMOTIL) 2.5-0.025 MG  tablet, Take 1 tablet by mouth 4 (four) times daily as needed for diarrhea or loose stools. (Patient not taking: Reported on 09/29/2022), Disp: 30 tablet, Rfl: 1   fish oil-omega-3 fatty acids 1000 MG capsule, Take 1 g by mouth daily., Disp: , Rfl:    gabapentin (NEURONTIN) 300 MG capsule, Take 1 capsule (300 mg total) by mouth at bedtime. (Patient not taking: Reported on 09/29/2022), Disp: 30 capsule, Rfl: 1   Lancets (ONETOUCH DELICA PLUS LANCET33G) MISC, TEST ONCE A DAY AS DIRECTED, Disp: , Rfl:    lansoprazole (PREVACID) 30 MG capsule, Take 30 mg by mouth daily at 12 noon., Disp: , Rfl:    levothyroxine (SYNTHROID, LEVOTHROID) 100 MCG tablet, Take 100 mcg by mouth daily., Disp: , Rfl:    metoprolol tartrate (LOPRESSOR) 100 MG tablet, Take 1 tablet (100 mg total) by mouth 2 (two) times daily., Disp: 30 tablet, Rfl: 5   Misc Natural Products (OSTEO BI-FLEX/5-LOXIN ADVANCED PO), Take by mouth 2 (two) times daily., Disp: , Rfl:    ONETOUCH VERIO test strip, USE TO TEST SUGAR 3 TIMES DAILY, Disp: , Rfl:    ramipril (ALTACE) 5 MG capsule, Take 5 mg by mouth daily., Disp: , Rfl:    rosuvastatin (CRESTOR) 20 MG tablet, Take 10 mg by mouth daily. , Disp: , Rfl:    triamterene-hydrochlorothiazide (MAXZIDE-25) 37.5-25 MG tablet, Take 1 tablet by mouth every  other day., Disp: , Rfl:   Social History   Tobacco Use  Smoking Status Never  Smokeless Tobacco Never    Allergies  Allergen Reactions   Butorphanol Other (See Comments)   Butorphanol Tartrate     REACTION: sweating, increased heartrate   Cardizem [Diltiazem Hcl] Other (See Comments)    Per pt cause b/p to drop low   Clarithromycin     Can't remember   Meperidine Hcl     REACTION: itching     Naloxone     Pt questions allergies, is unaware of ever having, does not sound familiar   Other Other (See Comments)   Penicillins     REACTION: unspecified- as a child   Sulfa Antibiotics    Sulfonamide Derivatives     REACTION: does not  remember   Objective:  There were no vitals filed for this visit. There is no height or weight on file to calculate BMI. Constitutional Well developed. Well nourished.  Vascular Dorsalis pedis pulses palpable bilaterally. Posterior tibial pulses palpable bilaterally. Capillary refill normal to all digits.  No cyanosis or clubbing noted. Pedal hair growth normal.  Neurologic Normal speech. Oriented to person, place, and time. Epicritic sensation to light touch grossly present bilaterally.  Dermatologic Right first metatarsophalangeal joint skin fissure with severe dryness noted.  Epidermolysis noted to the right plantar foot subjective component of itching.  No open wounds or lesion noted  Orthopedic: Normal joint ROM without pain or crepitus bilaterally. No visible deformities. No bony tenderness.   Radiographs: None Assessment:   1. Athlete's foot, right    Plan:  Patient was evaluated and treated and all questions answered.  Right first metatarsophalangeal joint skin fissure with underlying athlete's foot -All questions and concerns were discussed with the patient in extensive detail just given the amount of athlete's foot that is present patient benefit from Lotrisone cream to be applied twice a day -Lotrisone cream was sent to the pharmacy  No follow-ups on file.  Right first mpj skin fissure debridment   Right foot skin epidermilosys

## 2023-02-03 ENCOUNTER — Encounter: Payer: Self-pay | Admitting: Physician Assistant

## 2023-02-03 ENCOUNTER — Ambulatory Visit: Payer: Medicare Other | Admitting: Physician Assistant

## 2023-02-03 DIAGNOSIS — M1712 Unilateral primary osteoarthritis, left knee: Secondary | ICD-10-CM | POA: Diagnosis not present

## 2023-02-03 MED ORDER — METHYLPREDNISOLONE ACETATE 40 MG/ML IJ SUSP
40.0000 mg | INTRAMUSCULAR | Status: AC | PRN
Start: 2023-02-03 — End: 2023-02-03
  Administered 2023-02-03: 40 mg via INTRA_ARTICULAR

## 2023-02-03 MED ORDER — LIDOCAINE HCL 1 % IJ SOLN
3.0000 mL | INTRAMUSCULAR | Status: AC | PRN
Start: 2023-02-03 — End: 2023-02-03
  Administered 2023-02-03: 3 mL

## 2023-02-03 NOTE — Progress Notes (Signed)
Office Visit Note   [name]           Date of Birth: 1946/04/28           MRN: [id number] Visit Date: 02/03/2023              Requested by: Fatima Sanger, FNP [address] [place],  [place] [address] PCP: Fatima Sanger, FNP  Chief Complaint  Patient presents with  . Right Knee - Pain  . Left Knee - Pain      HPI: Patient is a pleasant 77 year old woman who is a patient of Dr. Eliberto Ivory.  She comes in today complaining of fluid in her left greater than right knee.  She did have a Durolane injection in her right knee about a month ago.  She says it did not help at all and if anything made it more painful.  She was to come in for left knee Durolane injection but is declined to this.  She is mostly concerned of the fluid buildup in her left knee denies any injury  Assessment & Plan: Visit Diagnoses: Osteoarthritis bilateral knees  Plan: She is going to follow-up with Dr. Magnus Ivan to discuss knee replacement as she has failed conservative treatment including steroid injections viscosupplementation and has recurrent effusions.  I will go forward and aspirate her left knee and injected with steroid today.  She thinks if she goes forward with knee replacement she would start with the right knee  Follow-Up Instructions: No follow-ups on file.   Ortho Exam  Patient is alert, oriented, no adenopathy, well-dressed, normal affect, normal respiratory effort. Bilateral knees right knee mild effusion but no redness no erythema compartments are soft and nontender.  Left knee she has a moderate effusion no redness no ecchymosis no signs of infection she is neurovascularly intact compartments are soft and nontender  Imaging: No results found. No images are attached to the encounter.  Labs: Lab Results  Component Value Date   HGBA1C  07/01/2008    5.7 (NOTE)   The ADA recommends the following therapeutic goal for glycemic   control related to Hgb  A1C measurement:   Goal of Therapy:   < 7.0% Hgb A1C   Reference: American Diabetes Association: Clinical Practice   Recommendations 2008, Diabetes Care,  2008, 31:(Suppl 1).     Lab Results  Component Value Date   ALBUMIN 4.6 02/16/2019   ALBUMIN 4.0 06/30/2008    Lab Results  Component Value Date   MG 1.8 04/28/2018   MG 2.1 07/01/2008   MG 2.1 06/30/2008   No results found for: "VD25OH"  No results found for: "PREALBUMIN"    Latest Ref Rng & Units 05/16/2020    2:49 PM 01/10/2016    6:22 AM 07/01/2008    6:00 AM  CBC EXTENDED  WBC 4.0 - 10.5 K/uL 12.0  11.3  8.9   RBC 3.87 - 5.11 Mil/uL 4.17  3.96  4.23   Hemoglobin 12.0 - 15.0 g/dL 19.1  47.8  29.5   HCT 36.0 - 46.0 % 36.9  35.4  38.1   Platelets 150.0 - 400.0 K/uL 298.0  267  247   NEUT# 1.4 - 7.7 K/uL 7.8  8.8    Lymph# 0.7 - 4.0 K/uL 3.0  1.9       There is no height or weight on file to calculate BMI.  Orders:  No orders of the defined types were placed in this encounter.  No  orders of the defined types were placed in this encounter.    Procedures: Large Joint Inj: L knee on 02/03/2023 11:02 AM Indications: pain and diagnostic evaluation Details: 25 G 1.5 in needle, superolateral approach  Arthrogram: No  Medications: 40 mg methylPREDNISolone acetate 40 MG/ML; 3 mL lidocaine 1 % Outcome: tolerated well, no immediate complications  After obtaining verbal consent the superior lateral portion of her knee was prepped with alcohol and Betadine x 2.  3 cc of lidocaine plain was injected.  After adequate analgesia 25 cc of serous fluid was aspirated from her knee.  2 cc of bupivacaine and 1 cc of Depo-Medrol were injected she tolerated the procedure well Band-Aid and Ace wrap applied Procedure, treatment alternatives, risks and benefits explained, specific risks discussed. Consent was given by the patient.    Clinical Data: No additional findings.  ROS:  All other systems negative, except as noted in the  HPI. Review of Systems  Objective: Vital Signs: There were no vitals taken for this visit.  Specialty Comments:  No specialty comments available.  PMFS History: Patient Active Problem List   Diagnosis Date Noted  . Unilateral primary osteoarthritis, right knee 01/06/2018  . Palpitations   . Essential hypertension 01/27/2016  . Hypothyroidism 01/27/2016  . Right bundle branch block 01/27/2016  . Hyperlipidemia LDL goal <70 01/27/2016  . Bilateral lower extremity edema 01/27/2016   Past Medical History:  Diagnosis Date  . Allergy    seasonal  . Anxiety   . Arthritis   . Cataract    removal bil  . Essential hypertension   . Heart murmur    a. 01/2016 Echo: EF 65-70%, no rwma, Gr1 DD, mild MR/TR, mild to mod PR, PASP .  Marland Kitchen History of chronic back pain   . Hyperlipidemia   . Hypothyroidism   . Osteoarthritis   . Palpitations     Family History  Problem Relation Age of Onset  . Colon polyps Sister   . Colon cancer Sister 62  . Esophageal cancer Neg Hx   . Pancreatic cancer Neg Hx   . Prostate cancer Neg Hx   . Rectal cancer Neg Hx   . Stomach cancer Neg Hx     Past Surgical History:  Procedure Laterality Date  . ABDOMINAL HYSTERECTOMY    . ANTERIOR FUSION CERVICAL SPINE     3 cervical fusions  . APPENDECTOMY    . BREAST EXCISIONAL BIOPSY Right    benign  . CHOLECYSTECTOMY    . COLONOSCOPY    . TONSILLECTOMY AND ADENOIDECTOMY     Social History   Occupational History  . Not on file  Tobacco Use  . Smoking status: Never  . Smokeless tobacco: Never  Vaping Use  . Vaping Use: Never used  Substance and Sexual Activity  . Alcohol use: No  . Drug use: No  . Sexual activity: Not on file

## 2023-02-23 ENCOUNTER — Ambulatory Visit: Payer: Medicare Other | Admitting: Orthopaedic Surgery

## 2023-02-23 DIAGNOSIS — L821 Other seborrheic keratosis: Secondary | ICD-10-CM | POA: Diagnosis not present

## 2023-02-24 DIAGNOSIS — E039 Hypothyroidism, unspecified: Secondary | ICD-10-CM | POA: Diagnosis not present

## 2023-02-24 DIAGNOSIS — I10 Essential (primary) hypertension: Secondary | ICD-10-CM | POA: Diagnosis not present

## 2023-02-24 DIAGNOSIS — E789 Disorder of lipoprotein metabolism, unspecified: Secondary | ICD-10-CM | POA: Diagnosis not present

## 2023-02-24 DIAGNOSIS — E119 Type 2 diabetes mellitus without complications: Secondary | ICD-10-CM | POA: Diagnosis not present

## 2023-03-03 DIAGNOSIS — E039 Hypothyroidism, unspecified: Secondary | ICD-10-CM | POA: Diagnosis not present

## 2023-03-03 DIAGNOSIS — E1151 Type 2 diabetes mellitus with diabetic peripheral angiopathy without gangrene: Secondary | ICD-10-CM | POA: Diagnosis not present

## 2023-03-03 DIAGNOSIS — E7800 Pure hypercholesterolemia, unspecified: Secondary | ICD-10-CM | POA: Diagnosis not present

## 2023-03-03 DIAGNOSIS — I10 Essential (primary) hypertension: Secondary | ICD-10-CM | POA: Diagnosis not present

## 2023-03-03 DIAGNOSIS — Z0000 Encounter for general adult medical examination without abnormal findings: Secondary | ICD-10-CM | POA: Diagnosis not present

## 2023-03-13 ENCOUNTER — Ambulatory Visit: Payer: Medicare Other | Admitting: Podiatry

## 2023-03-13 DIAGNOSIS — Q828 Other specified congenital malformations of skin: Secondary | ICD-10-CM | POA: Diagnosis not present

## 2023-03-13 NOTE — Progress Notes (Signed)
Subjective:  Patient ID: Misty Newman, female    DOB: 02/24/1946,  MRN: [id number]  Chief Complaint  Patient presents with   Callouses    77 y.o. female presents with the above complaint.  Patient presents with right submetatarsal 1 porokeratotic lesion painful to touch is progressive and worse hurts with ambulation is with pressure she would like me to do reduction as noted with her daughter's help.  She denies any other acute complaints   Review of Systems: Negative except as noted in the HPI. Denies N/V/F/Ch.  Past Medical History:  Diagnosis Date   Allergy    seasonal   Anxiety    Arthritis    Cataract    removal bil   Essential hypertension    Heart murmur    a. 01/2016 Echo: EF 65-70%, no rwma, Gr1 DD, mild MR/TR, mild to mod PR, PASP .   History of chronic back pain    Hyperlipidemia    Hypothyroidism    Osteoarthritis    Palpitations     Current Outpatient Medications:    budesonide (ENTOCORT EC) 3 MG 24 hr capsule, TAKE 3 CAPSULES (9 MG TOTAL) BY MOUTH ONCE DAILY., Disp: 90 capsule, Rfl: 1   cholecalciferol (VITAMIN D3) 25 MCG (1000 UT) tablet, Take 1,000 Units by mouth daily., Disp: , Rfl:    clotrimazole-betamethasone (LOTRISONE) cream, Apply 1 Application topically daily., Disp: 30 g, Rfl: 0   cyclobenzaprine (FLEXERIL) 10 MG tablet, Take 10 mg by mouth daily., Disp: , Rfl:    diclofenac (VOLTAREN) 75 MG EC tablet, Take 75 mg by mouth daily., Disp: , Rfl:    dicyclomine (BENTYL) 10 MG capsule, TAKE 1 CAPSULE (10 MG TOTAL) BY MOUTH EVERY 8 (EIGHT) HOURS AS NEEDED (CRAMPS AND DIARRHEA). (Patient not taking: Reported on 09/29/2022), Disp: 270 capsule, Rfl: 1   diphenoxylate-atropine (LOMOTIL) 2.5-0.025 MG tablet, Take 1 tablet by mouth 4 (four) times daily as needed for diarrhea or loose stools. (Patient not taking: Reported on 09/29/2022), Disp: 30 tablet, Rfl: 1   fish oil-omega-3 fatty acids 1000 MG capsule, Take 1 g by mouth daily., Disp: , Rfl:     gabapentin (NEURONTIN) 300 MG capsule, Take 1 capsule (300 mg total) by mouth at bedtime. (Patient not taking: Reported on 09/29/2022), Disp: 30 capsule, Rfl: 1   Lancets (ONETOUCH DELICA PLUS LANCET33G) MISC, TEST ONCE A DAY AS DIRECTED, Disp: , Rfl:    lansoprazole (PREVACID) 30 MG capsule, Take 30 mg by mouth daily at 12 noon., Disp: , Rfl:    levothyroxine (SYNTHROID, LEVOTHROID) 100 MCG tablet, Take 100 mcg by mouth daily., Disp: , Rfl:    metoprolol tartrate (LOPRESSOR) 100 MG tablet, Take 1 tablet (100 mg total) by mouth 2 (two) times daily., Disp: 30 tablet, Rfl: 5   Misc Natural Products (OSTEO BI-FLEX/5-LOXIN ADVANCED PO), Take by mouth 2 (two) times daily., Disp: , Rfl:    ONETOUCH VERIO test strip, USE TO TEST SUGAR 3 TIMES DAILY, Disp: , Rfl:    ramipril (ALTACE) 5 MG capsule, Take 5 mg by mouth daily., Disp: , Rfl:    rosuvastatin (CRESTOR) 20 MG tablet, Take 10 mg by mouth daily. , Disp: , Rfl:    triamterene-hydrochlorothiazide (MAXZIDE-25) 37.5-25 MG tablet, Take 1 tablet by mouth every other day., Disp: , Rfl:   Social History   Tobacco Use  Smoking Status Never  Smokeless Tobacco Never    Allergies  Allergen Reactions   Butorphanol Other (See Comments)   Butorphanol Tartrate  REACTION: sweating, increased heartrate   Cardizem [Diltiazem Hcl] Other (See Comments)    Per pt cause b/p to drop low   Clarithromycin     Can't remember   Meperidine Hcl     REACTION: itching     Naloxone     Pt questions allergies, is unaware of ever having, does not sound familiar   Other Other (See Comments)   Penicillins     REACTION: unspecified- as a child   Sulfa Antibiotics    Sulfonamide Derivatives     REACTION: does not remember   Objective:  There were no vitals filed for this visit. There is no height or weight on file to calculate BMI. Constitutional Well developed. Well nourished.  Vascular Dorsalis pedis pulses palpable bilaterally. Posterior tibial pulses  palpable bilaterally. Capillary refill normal to all digits.  No cyanosis or clubbing noted. Pedal hair growth normal.  Neurologic Normal speech. Oriented to person, place, and time. Epicritic sensation to light touch grossly present bilaterally.  Dermatologic Right submet 1 porokeratotic lesion painful to touch is progressive getting worse hurts with ambulation worse with pressure  Orthopedic: Normal joint ROM without pain or crepitus bilaterally. No visible deformities. No bony tenderness.   Radiographs: None Assessment:   1. Porokeratosis    Plan:  Patient was evaluated and treated and all questions answered.  Right submet 1 porokeratosis -All questions and concerns were discussed with the patient extensive detail given the amount of pain that she is having she will benefit from debridement of the lesion using chisel blade and handle the lesion was debrided down to healthy striated tissue.  No complication noted no pinpoint bleeding noted.  No follow-ups on file.

## 2023-03-16 ENCOUNTER — Encounter: Payer: Self-pay | Admitting: Orthopaedic Surgery

## 2023-03-16 ENCOUNTER — Ambulatory Visit: Payer: Medicare Other | Admitting: Orthopaedic Surgery

## 2023-03-16 VITALS — Ht 67.0 in | Wt 167.0 lb

## 2023-03-16 DIAGNOSIS — M25562 Pain in left knee: Secondary | ICD-10-CM | POA: Diagnosis not present

## 2023-03-16 DIAGNOSIS — M1711 Unilateral primary osteoarthritis, right knee: Secondary | ICD-10-CM

## 2023-03-16 DIAGNOSIS — G8929 Other chronic pain: Secondary | ICD-10-CM | POA: Diagnosis not present

## 2023-03-16 DIAGNOSIS — M25561 Pain in right knee: Secondary | ICD-10-CM | POA: Diagnosis not present

## 2023-03-16 DIAGNOSIS — M1712 Unilateral primary osteoarthritis, left knee: Secondary | ICD-10-CM

## 2023-03-16 NOTE — Progress Notes (Signed)
The patient comes in today hoping to have an aspiration of her right knee and maybe her left knee.  We have been following her for bilateral knee arthritis with the right knee as the ones only bother her the worst.  Back in early June she did have hyaluronic acid in her right knee and a steroid injection in her left knee.  She is diabetic but reports good blood glucose control.  On exam both knees are slightly warm.  The right knee has moderate effusion and the left knee has a mild effusion.  I did aspirate 25 cc of clear yellow fluid from the right knee and about 10 to 15 cc from her left knee.  This was clear as well.  This is consistent with arthritis.  She knows the next time she can have a gel injection in the right knee with the in January.  We can also place steroid injections in 3 months in both knees if needed.

## 2023-03-18 ENCOUNTER — Other Ambulatory Visit: Payer: Self-pay | Admitting: Internal Medicine

## 2023-03-18 DIAGNOSIS — Z1231 Encounter for screening mammogram for malignant neoplasm of breast: Secondary | ICD-10-CM

## 2023-04-27 ENCOUNTER — Ambulatory Visit: Payer: Medicare Other

## 2023-04-28 DIAGNOSIS — E039 Hypothyroidism, unspecified: Secondary | ICD-10-CM | POA: Diagnosis not present

## 2023-04-28 DIAGNOSIS — E1165 Type 2 diabetes mellitus with hyperglycemia: Secondary | ICD-10-CM | POA: Diagnosis not present

## 2023-04-28 DIAGNOSIS — E1151 Type 2 diabetes mellitus with diabetic peripheral angiopathy without gangrene: Secondary | ICD-10-CM | POA: Diagnosis not present

## 2023-04-30 ENCOUNTER — Other Ambulatory Visit: Payer: Self-pay | Admitting: Registered Nurse

## 2023-04-30 ENCOUNTER — Ambulatory Visit
Admission: RE | Admit: 2023-04-30 | Discharge: 2023-04-30 | Disposition: A | Discharge status: Home / Self Care | Payer: Medicare Other | Source: Ambulatory Visit | Attending: Internal Medicine | Admitting: Internal Medicine

## 2023-04-30 DIAGNOSIS — Z1231 Encounter for screening mammogram for malignant neoplasm of breast: Secondary | ICD-10-CM | POA: Diagnosis not present

## 2023-05-06 ENCOUNTER — Ambulatory Visit: Payer: Medicare Other | Admitting: Orthopaedic Surgery

## 2023-05-06 DIAGNOSIS — M1711 Unilateral primary osteoarthritis, right knee: Secondary | ICD-10-CM | POA: Diagnosis not present

## 2023-05-06 DIAGNOSIS — M1712 Unilateral primary osteoarthritis, left knee: Secondary | ICD-10-CM | POA: Diagnosis not present

## 2023-05-06 MED ORDER — LIDOCAINE HCL 1 % IJ SOLN
3.0000 mL | INTRAMUSCULAR | Status: AC | PRN
Start: 2023-05-06 — End: 2023-05-06
  Administered 2023-05-06: 3 mL

## 2023-05-06 MED ORDER — METHYLPREDNISOLONE ACETATE 40 MG/ML IJ SUSP
40.0000 mg | INTRAMUSCULAR | Status: AC | PRN
Start: 2023-05-06 — End: 2023-05-06
  Administered 2023-05-06: 40 mg via INTRA_ARTICULAR

## 2023-05-06 MED ORDER — LIDOCAINE HCL 1 % IJ SOLN
4.0000 mL | INTRAMUSCULAR | Status: AC | PRN
Start: 2023-05-06 — End: 2023-05-06
  Administered 2023-05-06: 4 mL

## 2023-05-06 NOTE — Progress Notes (Signed)
   Procedure Note  [name]             Date of Birth: [birth year]           MRN: [id number]             Visit Date: 05/06/2023 HPI: Mrs. Marchenko comes in today requesting aspirations both knees.  She is also interested in possible knee replacement.  She states both knees feel tight.  She has had no acute injury of either knee.  Currently her right knee is more bothersome than the left.  Physical exam: General Well-developed well-nourished female who ambulates without any assistive device.  Bilateral knees patellofemoral crepitus.  Positive effusion bilateral knees.  No abnormal warmth erythema of either knee.  No instability valgus varus stressing of either knee.  Procedures: Visit Diagnoses:  1. Unilateral primary osteoarthritis, left knee   2. Unilateral primary osteoarthritis, right knee     Large Joint Inj: bilateral knee on 05/06/2023 11:37 AM Indications: pain Details: 22 G 1.5 in needle, superolateral approach  Arthrogram: No  Medications (Right): 3 mL lidocaine 1 % Aspirate (Right): 15 mL yellow Medications (Left): 4 mL lidocaine 1 %; 40 mg methylPREDNISolone acetate 40 MG/ML Aspirate (Left): 25 mL bloody Outcome: tolerated well, no immediate complications Procedure, treatment alternatives, risks and benefits explained, specific risks discussed. Consent was given by the patient. Immediately prior to procedure a time out was called to verify the correct patient, procedure, equipment, support staff and site/side marked as required. Patient was prepped and draped in the usual sterile fashion.     Plan: This point time she did ask about knee replacement but would like to think about it with little bit more before committing to a knee replacement.  Given the fact that she is considering right knee replacement we did not place cortisone injection in her right knee today.  She will let us know if she wants to proceed with knee replacement.  Did discuss with her risk,  perioperative and postoperative care.  Her main concern is she lives alone.  Both knees were wrapped with Ace bandages which she will remove this evening before retiring to bed.  Questions were encouraged and answered by Dr. Magnus Ivan myself.

## 2023-06-10 IMAGING — MR MR LUMBAR SPINE W/O CM
4 of 5 series · 26 of 48 positions shown · non-contrast
Comparison: Lumbar radiographs 07/04/2021.  Lumbar MRI 09/23/2020.

CLINICAL DATA: 75-year-old female with low back pain radiating to
the left buttock and leg for 2 months. Left leg weakness and
numbness. No known injury.

EXAM:
MRI LUMBAR SPINE WITHOUT CONTRAST
TECHNIQUE: Multiplanar, multisequence MR imaging of the lumbar spine was
performed. No intravenous contrast was administered.

[Series 3: T2 · sagittal · 4.0mm · 0.59mm/px · 6 of 17 slices shown (1 of 2)]
[im 1/17]
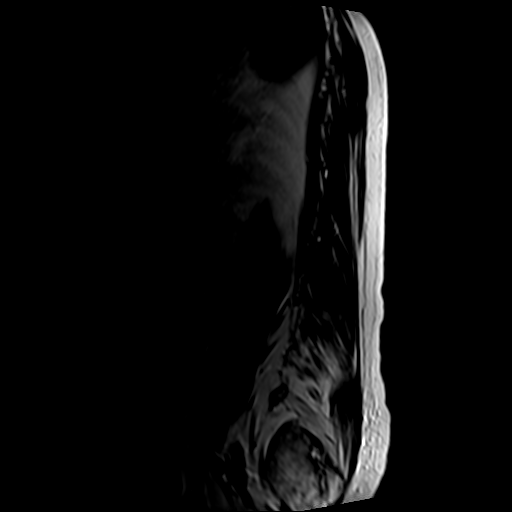
[im 4/17]
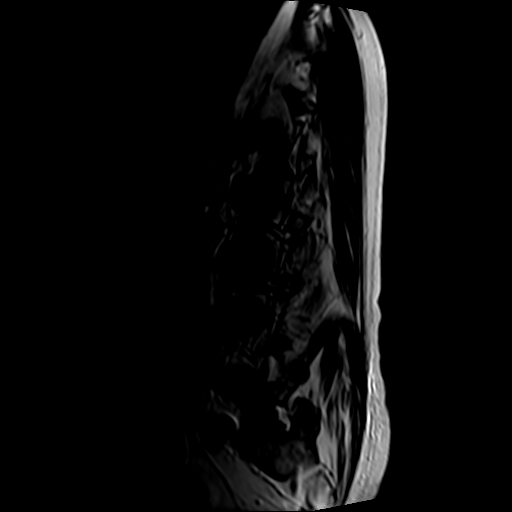
[im 7/17]
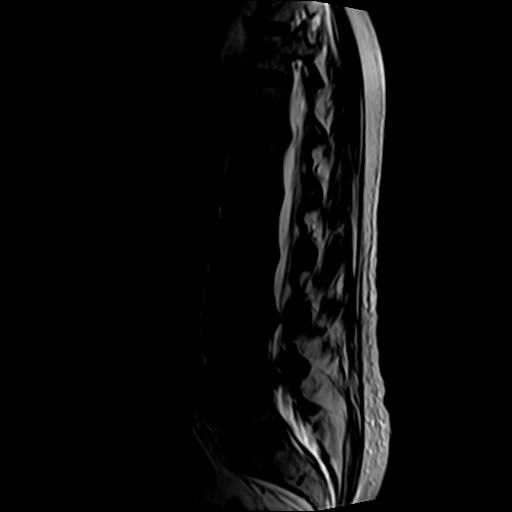
[im 10/17]
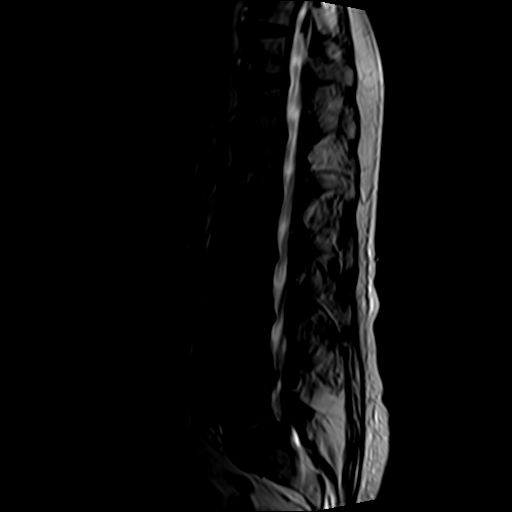
[im 13/17]
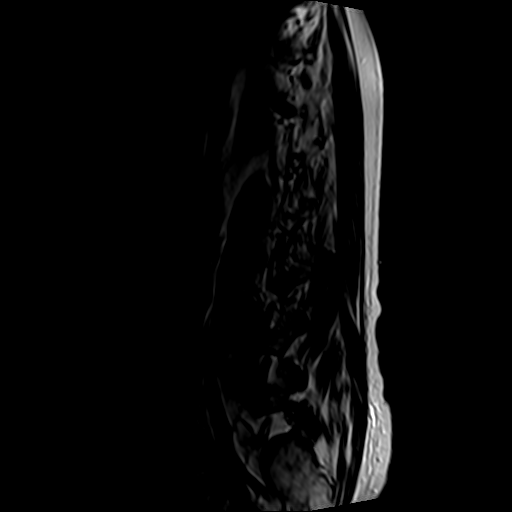
[im 17/17]
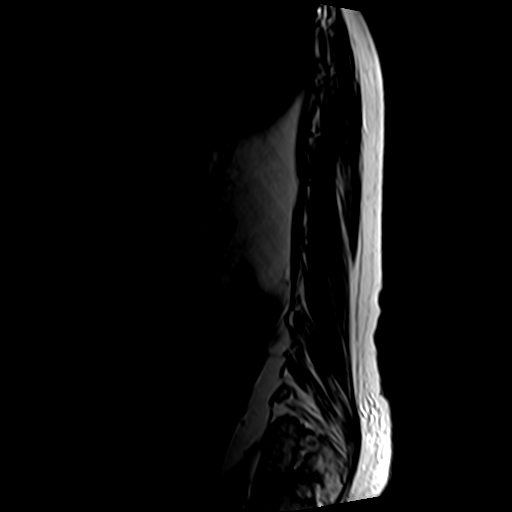

[Series 5: T1 · sagittal · 4.0mm · 0.59mm/px · 5 of 15 slices shown (1 of 2)]
[im 1/15]
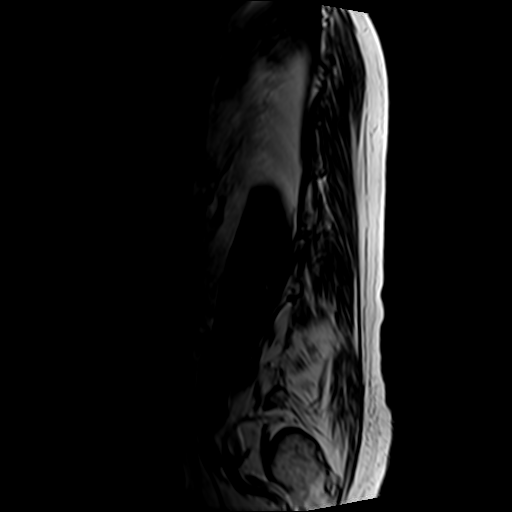
[im 4/15]
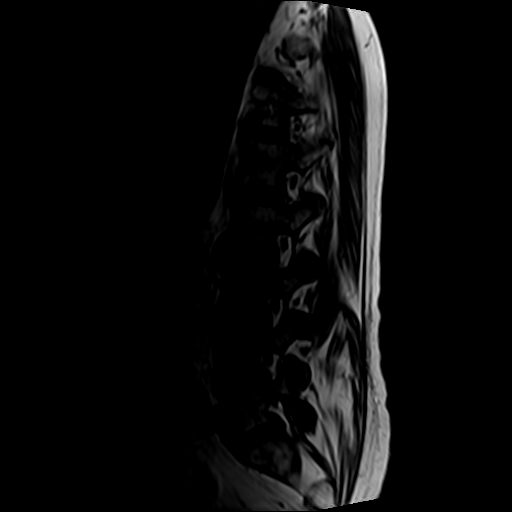
[im 8/15]
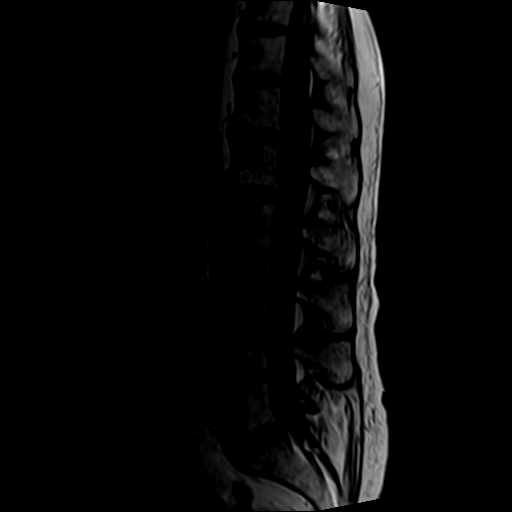
[im 11/15]
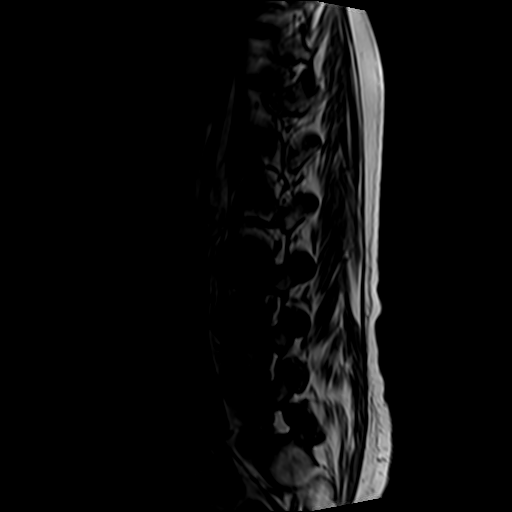
[im 15/15]
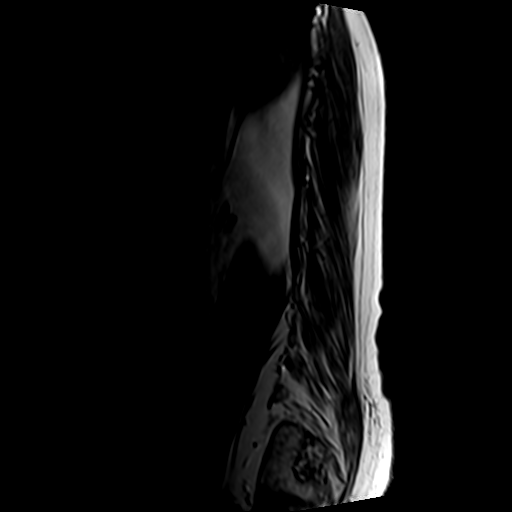

[Series 7: T2 · axial · 4.0mm · 0.78mm/px · z∈[-99,+125]mm · 10 of 44 slices shown (2 of 2)]
[im 3/44]
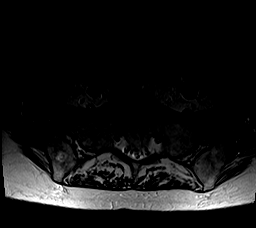
[im 6/44]
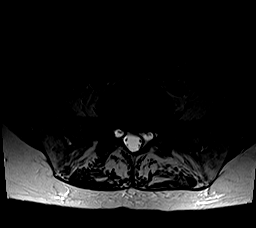
[im 9/44]
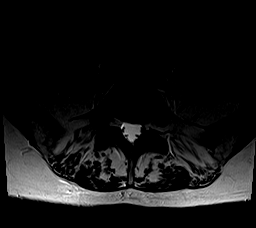
[im 15/44]
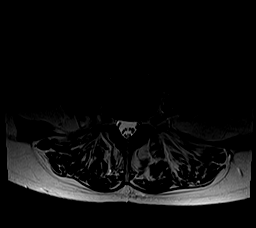
[im 21/44]
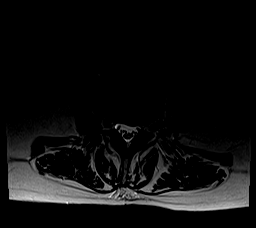
[im 23/44]
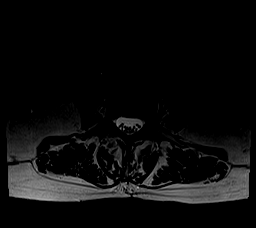
[im 26/44]
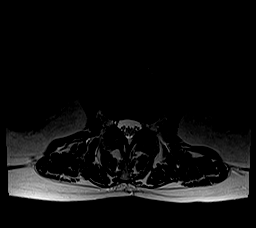
[im 32/44]
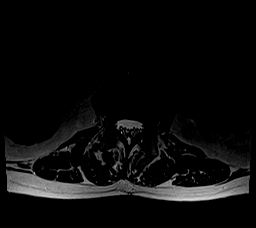
[im 38/44]
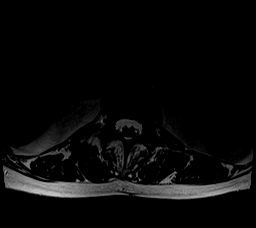
[im 44/44]
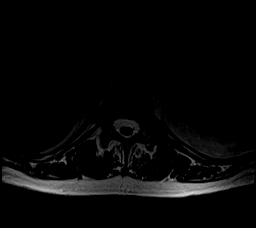

[Series 8: T1 · axial · 4.0mm · 0.39mm/px · z∈[-99,+94]mm · 5 of 44 slices shown (2 of 2)]
[im 3/44]
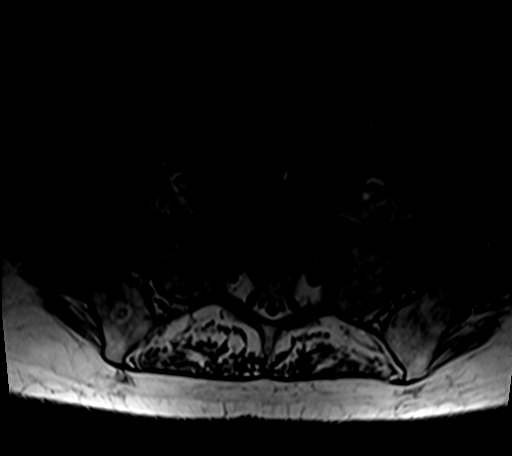
[im 6/44]
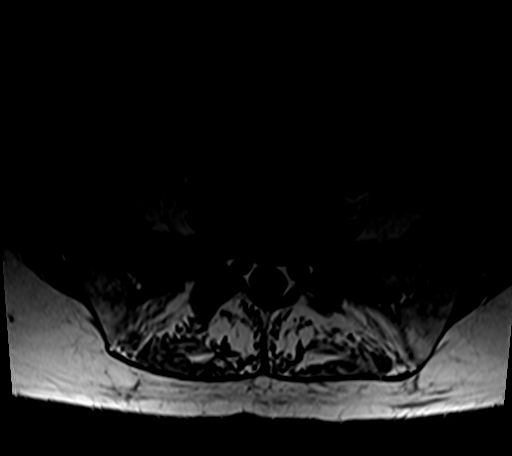
[im 9/44]
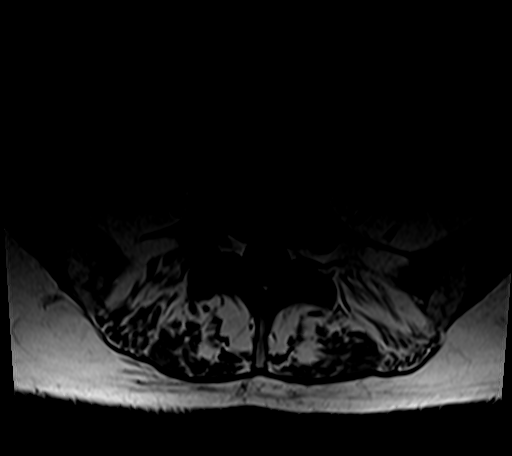
[im 23/44]
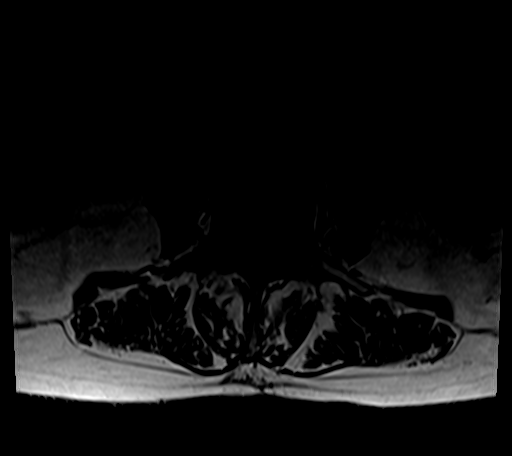
[im 38/44]
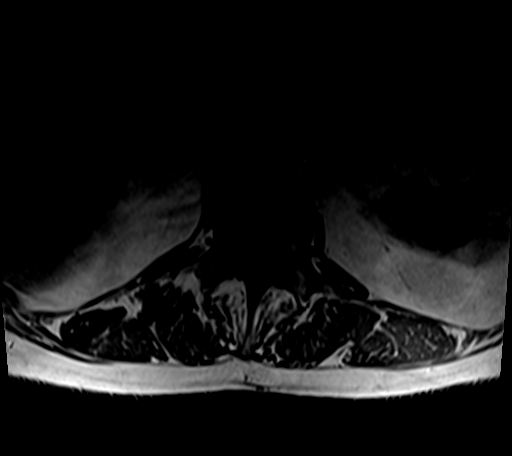

[26 of 48 positions shown; findings below may reference images not displayed]

FINDINGS: Segmentation: Normal, the same numbering system used on the [facility]
MRI.

Alignment: Stable straightening of lumbar lordosis with slight
lumbar scoliosis. No spondylolisthesis.

Vertebrae: No marrow edema or evidence of acute osseous abnormality.
Normal background bone marrow signal. Small benign vertebral
hemangioma at the right inferior L1 body. Intact visible sacrum.

Conus medullaris and cauda equina: Conus extends to the L1 level. No
lower spinal cord or conus signal abnormality.

Paraspinal and other soft tissues: Negative.

Disc levels:

T10-T11: Grossly negative.

T11-T12: Small central disc protrusion.  No stenosis.

T12-L1:  Mildly lobulated posterior disc bulging.  No stenosis.

L1-L2:  Negative.

L2-L3:  Subtle disc bulging.  No stenosis.

L3-L4: Mild circumferential disc bulge, and a left paracentral small
disc protrusion or extrusion appears increased (series 7, image 25
and series 3, image 10). No significant spinal stenosis but
increased mild left lateral recess stenosis (left L4 nerve level).
No foraminal involvement or stenosis.

L4-L5: Circumferential disc bulge with mildly lobulated posterior
component. Mild facet and ligament flavum hypertrophy greater on the
left. Moderate left lateral recess stenosis appears increased, with
evidence of a small but increased left lateral recess disc
protrusion there (series 3, image 11 - left L5 nerve level).
Borderline to mild spinal stenosis. No L4 foraminal involvement or
stenosis.

L5-S1: Left foraminal disc osteophyte complex is stable. Stable mild
facet hypertrophy. No spinal or lateral recess stenosis. Stable mild
left L5 foraminal stenosis.
IMPRESSION: 1. Small disc herniations affecting the left lateral recesses at
both the L3-L4 and L4-L5 levels have increased since [REDACTED].
Subsequent lateral recess stenosis appears more pronounced at the
latter. Query left L5 radiculitis.
And there is up to mild associated L4-L5 spinal stenosis.

2. Stable mild left L5 foraminal stenosis related to chronic disc
osteophyte complex.

## 2023-06-23 ENCOUNTER — Telehealth: Payer: Self-pay | Admitting: Physician Assistant

## 2023-06-23 NOTE — Telephone Encounter (Signed)
Pt called stating she had fluid build up on both knee. Pt states she would love to be seen sooner than 10/31. I added pt to [facility] cancellation list. Pt phone number is [phone]

## 2023-06-25 ENCOUNTER — Ambulatory Visit: Payer: Medicare Other | Admitting: Physician Assistant

## 2023-06-25 DIAGNOSIS — M1711 Unilateral primary osteoarthritis, right knee: Secondary | ICD-10-CM

## 2023-06-25 DIAGNOSIS — M170 Bilateral primary osteoarthritis of knee: Secondary | ICD-10-CM

## 2023-06-25 DIAGNOSIS — M1712 Unilateral primary osteoarthritis, left knee: Secondary | ICD-10-CM

## 2023-06-25 MED ORDER — LIDOCAINE HCL 1 % IJ SOLN
4.0000 mL | INTRAMUSCULAR | Status: AC | PRN
Start: 2023-06-25 — End: 2023-06-25
  Administered 2023-06-25: 4 mL

## 2023-06-25 MED ORDER — LIDOCAINE HCL 1 % IJ SOLN
5.0000 mL | INTRAMUSCULAR | Status: AC | PRN
Start: 2023-06-25 — End: 2023-06-25
  Administered 2023-06-25: 5 mL

## 2023-06-25 MED ORDER — METHYLPREDNISOLONE ACETATE 40 MG/ML IJ SUSP
40.0000 mg | INTRAMUSCULAR | Status: AC | PRN
Start: 2023-06-25 — End: 2023-06-25
  Administered 2023-06-25: 40 mg via INTRA_ARTICULAR

## 2023-06-25 NOTE — Progress Notes (Signed)
Procedure Note  [name]             Date of Birth: [birth year]           MRN: [id number]             Visit Date: 06/25/2023  HPI: Misty Newman comes in today requesting left knee aspiration and aspiration injection of the right knee.  She states both knees have fluid on them.  She is only taking Tylenol for the pain in her knees.  Last injection aspiration of the left knee was on 05/06/2023 and gave her good relief.  She has had no new injury.  No fevers chills.  She is nondiabetic.  Physical exam: Bilateral knees: Good range of motion of both knees.  Significant patellofemoral crepitus both knees.  No abnormal warmth erythema.  Positive effusion both knees.  No instability valgus varus stressing of either knee.  Procedures: Visit Diagnoses:  1. Unilateral primary osteoarthritis, left knee   2. Unilateral primary osteoarthritis, right knee     Large Joint Inj: bilateral knee on 06/25/2023 4:30 PM Indications: pain Details: 22 G 1.5 in needle, superolateral approach  Arthrogram: No  Medications (Right): 5 mL lidocaine 1 %; 40 mg methylPREDNISolone acetate 40 MG/ML Aspirate (Right): 30 mL blood-tinged Medications (Left): 4 mL lidocaine 1 % Aspirate (Left): 20 mL yellow Outcome: tolerated well, no immediate complications Procedure, treatment alternatives, risks and benefits explained, specific risks discussed. Consent was given by the patient. Immediately prior to procedure a time out was called to verify the correct patient, procedure, equipment, support staff and site/side marked as required. Patient was prepped and draped in the usual sterile fashion.     Plan: She understands to wait at least 3 months between cortisone injections.  Both knees were wrapped with Ace bandages she will remove these this evening before retiring to bed.  Follow-up with [place] as needed.  Questions were encouraged and answered

## 2023-07-02 ENCOUNTER — Ambulatory Visit: Payer: Medicare Other | Admitting: Physician Assistant

## 2023-08-06 DIAGNOSIS — M5416 Radiculopathy, lumbar region: Secondary | ICD-10-CM | POA: Diagnosis not present

## 2023-08-12 ENCOUNTER — Encounter (HOSPITAL_BASED_OUTPATIENT_CLINIC_OR_DEPARTMENT_OTHER): Payer: Self-pay

## 2023-08-12 ENCOUNTER — Emergency Department (HOSPITAL_BASED_OUTPATIENT_CLINIC_OR_DEPARTMENT_OTHER)
Admission: EM | Admit: 2023-08-12 | Discharge: 2023-08-13 | Disposition: A | Discharge status: Home / Self Care | Payer: Medicare Other | Attending: Emergency Medicine | Admitting: Emergency Medicine

## 2023-08-12 ENCOUNTER — Other Ambulatory Visit: Payer: Self-pay

## 2023-08-12 ENCOUNTER — Emergency Department (HOSPITAL_BASED_OUTPATIENT_CLINIC_OR_DEPARTMENT_OTHER): Payer: Medicare Other | Admitting: Radiology

## 2023-08-12 DIAGNOSIS — S8002XA Contusion of left knee, initial encounter: Secondary | ICD-10-CM

## 2023-08-12 DIAGNOSIS — I10 Essential (primary) hypertension: Secondary | ICD-10-CM | POA: Insufficient documentation

## 2023-08-12 DIAGNOSIS — S8991XA Unspecified injury of right lower leg, initial encounter: Secondary | ICD-10-CM | POA: Diagnosis present

## 2023-08-12 DIAGNOSIS — S8001XA Contusion of right knee, initial encounter: Secondary | ICD-10-CM | POA: Insufficient documentation

## 2023-08-12 DIAGNOSIS — W010XXA Fall on same level from slipping, tripping and stumbling without subsequent striking against object, initial encounter: Secondary | ICD-10-CM | POA: Diagnosis not present

## 2023-08-12 DIAGNOSIS — E119 Type 2 diabetes mellitus without complications: Secondary | ICD-10-CM | POA: Insufficient documentation

## 2023-08-12 DIAGNOSIS — S50812A Abrasion of left forearm, initial encounter: Secondary | ICD-10-CM

## 2023-08-12 DIAGNOSIS — E039 Hypothyroidism, unspecified: Secondary | ICD-10-CM | POA: Diagnosis not present

## 2023-08-12 DIAGNOSIS — Z79899 Other long term (current) drug therapy: Secondary | ICD-10-CM | POA: Diagnosis not present

## 2023-08-12 DIAGNOSIS — M25561 Pain in right knee: Secondary | ICD-10-CM | POA: Diagnosis not present

## 2023-08-12 DIAGNOSIS — M1711 Unilateral primary osteoarthritis, right knee: Secondary | ICD-10-CM | POA: Diagnosis not present

## 2023-08-12 NOTE — ED Triage Notes (Addendum)
Patient presents to ED reporting fall today at 1730 after tripping on dog's food bowl. Patient denies hitting head and denies blood thinners. Patient c/o left arm pain and right knee pain. Laceration noted to left forearm, bleeding controlled. Swelling noted to right knee, ice pack applied.

## 2023-08-12 NOTE — ED Notes (Signed)
Fall bracelet applied.   

## 2023-08-13 ENCOUNTER — Telehealth: Payer: Self-pay | Admitting: Orthopaedic Surgery

## 2023-08-13 NOTE — Discharge Instructions (Signed)
Wear Ace bandage for comfort and support.  Ice for 20 minutes every 2 hours while awake for the next 2 days.  Elevate your leg is much as possible.  Follow-up with your orthopedist if symptoms are not improving in the next week.

## 2023-08-13 NOTE — Telephone Encounter (Signed)
Patient called. Would like to be worked in to see Bronson Curb or Dr. Magnus Ivan. She fell and has a contusion. Her cb# [phone]

## 2023-08-13 NOTE — ED Provider Notes (Addendum)
[place] EMERGENCY DEPARTMENT AT [facility] Provider Note   CSN: [id number] Arrival date & time: 08/12/23  2014     History  Chief Complaint  Patient presents with   Misty Newman is a 77 y.o. female.  Patient is a 77 year old female with past medical history of hypothyroidism, diabetes, hypertension, hyperlipidemia, and osteoarthritis.  Patient presenting today for evaluation of a fall.  She reports tripping over the dog bowl and landing on her right knee.  She describes pain and swelling in the area of the patella.  She is able to bear weight and ambulate, but with some discomfort.  She also sustained a laceration to her left forearm.  Bleeding controlled with direct pressure.  The history is provided by the patient.       Home Medications Prior to Admission medications   Medication Sig Start Date End Date Taking? Authorizing Provider  budesonide (ENTOCORT EC) 3 MG 24 hr capsule TAKE 3 CAPSULES (9 MG TOTAL) BY MOUTH ONCE DAILY. 04/05/21   Sherrilyn Rist, MD  cholecalciferol (VITAMIN D3) 25 MCG (1000 UT) tablet Take 1,000 Units by mouth daily.    [provider]  clotrimazole-betamethasone (LOTRISONE) cream Apply 1 Application topically daily. 01/30/23   Candelaria Stagers, DPM  cyclobenzaprine (FLEXERIL) 10 MG tablet Take 10 mg by mouth daily.    [provider]  diclofenac (VOLTAREN) 75 MG EC tablet Take 75 mg by mouth daily.    [provider]  dicyclomine (BENTYL) 10 MG capsule TAKE 1 CAPSULE (10 MG TOTAL) BY MOUTH EVERY 8 (EIGHT) HOURS AS NEEDED (CRAMPS AND DIARRHEA). 03/04/21   Sherrilyn Rist, MD  diphenoxylate-atropine (LOMOTIL) 2.5-0.025 MG tablet Take 1 tablet by mouth 4 (four) times daily as needed for diarrhea or loose stools. 02/08/21   Sherrilyn Rist, MD  fish oil-omega-3 fatty acids 1000 MG capsule Take 1 g by mouth daily.    [provider]  gabapentin (NEURONTIN) 300 MG capsule Take 1 capsule (300 mg  total) by mouth at bedtime. 06/02/22   Kirtland Bouchard, PA-C  Lancets (ONETOUCH DELICA PLUS LANCET33G) MISC TEST ONCE A DAY AS DIRECTED 10/27/19   [provider]  lansoprazole (PREVACID) 30 MG capsule Take 30 mg by mouth daily at 12 noon.    [provider]  levothyroxine (SYNTHROID, LEVOTHROID) 100 MCG tablet Take 100 mcg by mouth daily.    [provider]  metoprolol tartrate (LOPRESSOR) 100 MG tablet Take 1 tablet (100 mg total) by mouth 2 (two) times daily. 02/15/19   Parke Poisson, MD  Misc Natural Products (OSTEO BI-FLEX/5-LOXIN ADVANCED PO) Take by mouth 2 (two) times daily.    [provider]  [facility] VERIO test strip USE TO TEST SUGAR 3 TIMES DAILY 09/24/19   [provider]  ramipril (ALTACE) 5 MG capsule Take 5 mg by mouth daily. 03/05/21   [provider]  rosuvastatin (CRESTOR) 20 MG tablet Take 10 mg by mouth daily.     [provider]  triamterene-hydrochlorothiazide (MAXZIDE-25) 37.5-25 MG tablet Take 1 tablet by mouth every other day.    [provider]      Allergies    Butorphanol, Butorphanol tartrate, Cardizem [diltiazem hcl], Clarithromycin, Meperidine hcl, Naloxone, Other, Penicillins, Sulfa antibiotics, and Sulfonamide derivatives    Review of Systems   Review of Systems  All other systems reviewed and are negative.   Physical Exam Updated Vital Signs BP (!) 194/97 (  BP Location: Left Arm)   Pulse 78   Temp 98.6 F (37 C)   Resp 18   Ht 5\' 7"  (1.702 m)   Wt 79.4 kg   SpO2 99%   BMI 27.41 kg/m  Physical Exam Vitals and nursing note reviewed.  Constitutional:      Appearance: Normal appearance.  Pulmonary:     Effort: Pulmonary effort is normal.  Musculoskeletal:     Comments: The right knee has mild swelling and ecchymosis overlying the inferior aspect of the patella.  There is no obvious deformity otherwise.  She has good range of motion with no crepitus.  There is no varus or  valgus instability and anterior/posterior drawer test are normal.  There are 2 small abrasions noted to the left forearm.  Bleeding is controlled.  She has full range of motion of the elbow and wrist without limitation.  Skin:    General: Skin is warm and dry.  Neurological:     Mental Status: She is alert and oriented to person, place, and time.     ED Results / Procedures / Treatments   Labs (all labs ordered are listed, but only abnormal results are displayed) Labs Reviewed - No data to display  EKG None  Radiology DG Knee 2 Views Right  Result Date: 08/12/2023 CLINICAL DATA:  Recent fall with knee pain, initial encounter EXAM: RIGHT KNEE - 2 VIEW COMPARISON:  11/10/2022 FINDINGS: Tricompartmental degenerative changes are noted. No joint effusion is seen. No acute fracture or dislocation is noted. IMPRESSION: Degenerative change without acute abnormality. Electronically Signed   By: Alcide Clever M.D.   On: 08/12/2023 22:35    Procedures Procedures    Medications Ordered in ED Medications - No data to display  ED Course/ Medical Decision Making/ A&P  Patient presenting with a right knee injury as described in the HPI.  The x-rays are negative and her examination is reassuring and inconsistent with internal derangement.  Patient will be placed in an Ace bandage, advised to rest, ice, and follow-up with her orthopedist if not improving.  The abrasion/laceration to the left forearm is not in need of suturing and can be treated with local wound care.  Final Clinical Impression(s) / ED Diagnoses Final diagnoses:  None    Rx / DC Orders ED Discharge Orders     None         Geoffery Lyons, MD 08/13/23 1610    Geoffery Lyons, MD 08/13/23 9604

## 2023-08-14 ENCOUNTER — Other Ambulatory Visit: Payer: Self-pay

## 2023-08-14 ENCOUNTER — Ambulatory Visit: Payer: Medicare Other | Admitting: Physician Assistant

## 2023-08-14 DIAGNOSIS — M25561 Pain in right knee: Secondary | ICD-10-CM | POA: Diagnosis not present

## 2023-08-14 NOTE — Progress Notes (Signed)
Office Visit Note   [name]           Date of Birth: 30-Dec-1945           MRN: [id number] Visit Date: 08/14/2023              Requested by: Fatima Sanger, FNP [address] [place],  [place] [address] PCP: Fatima Sanger, FNP  Chief Complaint  Patient presents with   Right Knee - Pain      HPI: Misty Newman is a pleasant 77 year old woman who is a patient of Dr. Magnus Ivan and Kriste Basque.  She is 2 days status post tripping over her dog bowl and falling directly on her right knee.  She was able to self ambulate and presented to the emergency room.  X-rays there demonstrated arthritic changes but no acute fracture.  She says she has a little bit more pain today but not using any type of assistive device  Assessment & Plan: Visit Diagnoses:  1. Acute pain of right knee     Plan: Contusion right knee no real indication for aspiration.  No evidence of infection.  Encouraged her to work on range of motion of the knee.  Could get a compressive sleeve if that feels better icing.  Will follow-up with myself or Gill in 2 weeks  Follow-Up Instructions: No follow-ups on file.   Ortho Exam  Patient is alert, oriented, no adenopathy, well-dressed, normal affect, normal respiratory effort. Right knee she is neurovascular intact has good dorsiflexion plantarflexion.  She can extend her knee and has good extension strength as flexion strength.  Globally tender.  No significant effusion but a lot of bruising especially over the patella  Imaging: No results found. No images are attached to the encounter.  Labs: Lab Results  Component Value Date   HGBA1C  07/01/2008    5.7 (NOTE)   The ADA recommends the following therapeutic goal for glycemic   control related to Hgb A1C measurement:   Goal of Therapy:   < 7.0% Hgb A1C   Reference: American Diabetes Association: Clinical Practice   Recommendations 2008, Diabetes Care,  2008, 31:(Suppl 1).     Lab  Results  Component Value Date   ALBUMIN 4.6 02/16/2019   ALBUMIN 4.0 06/30/2008    Lab Results  Component Value Date   MG 1.8 04/28/2018   MG 2.1 07/01/2008   MG 2.1 06/30/2008   No results found for: "VD25OH"  No results found for: "PREALBUMIN"    Latest Ref Rng & Units 05/16/2020    2:49 PM 01/10/2016    6:22 AM 07/01/2008    6:00 AM  CBC EXTENDED  WBC 4.0 - 10.5 K/uL 12.0  11.3  8.9   RBC 3.87 - 5.11 Mil/uL 4.17  3.96  4.23   Hemoglobin 12.0 - 15.0 g/dL 62.1  30.8  65.7   HCT 36.0 - 46.0 % 36.9  35.4  38.1   Platelets 150.0 - 400.0 K/uL 298.0  267  247   NEUT# 1.4 - 7.7 K/uL 7.8  8.8    Lymph# 0.7 - 4.0 K/uL 3.0  1.9       There is no height or weight on file to calculate BMI.  Orders:  No orders of the defined types were placed in this encounter.  No orders of the defined types were placed in this encounter.    Procedures: No procedures performed  Clinical Data: No additional findings.  ROS:  All other systems negative, except as noted in the HPI. Review of Systems  Objective: Vital Signs: There were no vitals taken for this visit.  Specialty Comments:  No specialty comments available.  PMFS History: Patient Active Problem List   Diagnosis Date Noted   Unilateral primary osteoarthritis, right knee 01/06/2018   Palpitations    Essential hypertension 01/27/2016   Hypothyroidism 01/27/2016   Right bundle branch block 01/27/2016   Hyperlipidemia LDL goal <70 01/27/2016   Bilateral lower extremity edema 01/27/2016   Past Medical History:  Diagnosis Date   Allergy    seasonal   Anxiety    Arthritis    Cataract    removal bil   Essential hypertension    Heart murmur    a. 01/2016 Echo: EF 65-70%, no rwma, Gr1 DD, mild MR/TR, mild to mod PR, PASP .   History of chronic back pain    Hyperlipidemia    Hypothyroidism    Osteoarthritis    Palpitations     Family History  Problem Relation Age of Onset   Colon polyps Sister    Colon  cancer Sister 32   Esophageal cancer Neg Hx    Pancreatic cancer Neg Hx    Prostate cancer Neg Hx    Rectal cancer Neg Hx    Stomach cancer Neg Hx     Past Surgical History:  Procedure Laterality Date   ABDOMINAL HYSTERECTOMY     ANTERIOR FUSION CERVICAL SPINE     3 cervical fusions   APPENDECTOMY     BREAST EXCISIONAL BIOPSY Right    benign   CHOLECYSTECTOMY     COLONOSCOPY     TONSILLECTOMY AND ADENOIDECTOMY     Social History   Occupational History   Not on file  Tobacco Use   Smoking status: Never   Smokeless tobacco: Never  Vaping Use   Vaping status: Never Used  Substance and Sexual Activity   Alcohol use: No   Drug use: No   Sexual activity: Not on file

## 2023-08-27 ENCOUNTER — Ambulatory Visit: Payer: Medicare Other | Attending: Student

## 2023-08-27 ENCOUNTER — Other Ambulatory Visit: Payer: Self-pay

## 2023-08-27 DIAGNOSIS — M5416 Radiculopathy, lumbar region: Secondary | ICD-10-CM | POA: Diagnosis not present

## 2023-08-27 DIAGNOSIS — Z9181 History of falling: Secondary | ICD-10-CM | POA: Insufficient documentation

## 2023-08-27 NOTE — Therapy (Signed)
OUTPATIENT PHYSICAL THERAPY THORACOLUMBAR EVALUATION   Patient Name: Misty Newman MRN: [id number] DOB:1945/10/17, 77 y.o., female Today's Date: 08/27/2023  END OF SESSION:  PT End of Session - 08/27/23 1312     Visit Number 1    Number of Visits 7    Date for PT Re-Evaluation 10/30/23    PT Start Time 1313    PT Stop Time 1345    PT Time Calculation (min) 32 min    Activity Tolerance Patient tolerated treatment well    Behavior During Therapy [facility] for tasks assessed/performed             Past Medical History:  Diagnosis Date   Allergy    seasonal   Anxiety    Arthritis    Cataract    removal bil   Essential hypertension    Heart murmur    a. 01/2016 Echo: EF 65-70%, no rwma, Gr1 DD, mild MR/TR, mild to mod PR, PASP .   History of chronic back pain    Hyperlipidemia    Hypothyroidism    Osteoarthritis    Palpitations    Past Surgical History:  Procedure Laterality Date   ABDOMINAL HYSTERECTOMY     ANTERIOR FUSION CERVICAL SPINE     3 cervical fusions   APPENDECTOMY     BREAST EXCISIONAL BIOPSY Right    benign   CHOLECYSTECTOMY     COLONOSCOPY     TONSILLECTOMY AND ADENOIDECTOMY     Patient Active Problem List   Diagnosis Date Noted   Unilateral primary osteoarthritis, right knee 01/06/2018   Palpitations    Essential hypertension 01/27/2016   Hypothyroidism 01/27/2016   Right bundle branch block 01/27/2016   Hyperlipidemia LDL goal <70 01/27/2016   Bilateral lower extremity edema 01/27/2016    PCP: Fatima Sanger, FNP  REFERRING PROVIDER: Floreen Comber, NP   REFERRING DIAG: Lumbar radiculopathy, chronic  Rationale for Evaluation and Treatment: Rehabilitation  THERAPY DIAG:  Radiculopathy, lumbar region  History of falling  ONSET DATE: November 2023  SUBJECTIVE:                                                                                                                                                                                            SUBJECTIVE STATEMENT: Patient that her back has been bothering her since November 2023 after having to bend over and pick up and clean up after her dog. Her pain starts in her left low back and radiates down her left leg. She was given Prednisone and that helped some, but she fell since she last saw her referring physician. She got her  foot hung up in her dogs food bowl. She went to the emergency room that same day (08/12/23) and her back and right knee have been hurting more since the fall.   PERTINENT HISTORY:  Allergies, hypertension, osteoarthritis, anxiety, cataracts, and chronic low back pain  PAIN:  Are you having pain? Yes: NPRS scale: 7/10 Pain location: low back and left leg to the knee  Pain description: constant soreness Aggravating factors: rolling over in bed and bending over Relieving factors: medication  PRECAUTIONS: Fall  RED FLAGS: None   WEIGHT BEARING RESTRICTIONS: No  FALLS:  Has patient fallen in last 6 months? Yes. Number of falls 1  LIVING ENVIRONMENT: Lives with: lives alone Lives in: House/apartment Stairs: Yes: External: 1 steps; none Has following equipment at home: None  OCCUPATION: retired  PLOF: Independent  PATIENT GOALS: reduced pain  NEXT MD VISIT: 09/17/23 (approximately)  OBJECTIVE:  Note: Objective measures were completed at Evaluation unless otherwise noted.  COGNITION: Overall cognitive status: Within functional limits for tasks assessed     SENSATION: Light touch: patient reports diminished sensation in bilateral lower extremities, but unable to accurately assess due to patient opening her eyes throughout the assessment Patient reports numbness in both feet with the left being worse than the right.   PALPATION: TTP: left lumbar paraspinals, QL, and L5 spinous processes with increased tone noted in bilateral lumbar paraspinals and QL  LUMBAR JOINT MOBILITY:  L1-4: hypomobile and nonpainful   L4-5:  hypomobile and painful   LUMBAR ROM:   AROM eval  Flexion 20; prior to knee flexion  Extension 10; minimal lumbar mobility  Right lateral flexion Patient unable to isolate lumbar side bending  Left lateral flexion Patient unable to isolate lumbar side bending  Right rotation 25% limited  Left rotation 25% limited; pain and pulling in left hip   (Blank rows = not tested)  LOWER EXTREMITY ROM:  WFL for activities assessed  LOWER EXTREMITY MMT:    MMT Right eval Left eval  Hip flexion 4-/5 3+/5  Hip extension    Hip abduction    Hip adduction    Hip internal rotation    Hip external rotation    Knee flexion 4+/5 4-/5  Knee extension 4/5 4/5  Ankle dorsiflexion 3+/5 3+/5  Ankle plantarflexion    Ankle inversion    Ankle eversion     (Blank rows = not tested)  FUNCTIONAL TESTS:  5 times sit to stand: 32.13 seconds without upper extremity support Timed up and go (TUG): 14.07 seconds  GAIT: Assistive device utilized: None Level of assistance: Complete Independence  TREATMENT DATE:                                                                                                                                  PATIENT EDUCATION:  Education details: plan of care, prognosis, objective findings, healing, and goals for therapy Person educated: Patient Education method: Explanation Education  comprehension: verbalized understanding  HOME EXERCISE PROGRAM:   ASSESSMENT:  CLINICAL IMPRESSION: Patient is a 77 y.o. female who was seen today for physical therapy evaluation and treatment for chronic low back and left lower extremity pain. She presented with moderate pain severity and irritability with palpation to her lumbar musculature and L4-5 joint mobility testing reproducing her familiar symptoms. She is also at an elevated fall risk as evidenced by her objective measures and her history of falling. Recommend that she continue with skilled physical therapy to address her  impairments to maximize her safety and functional mobility.    OBJECTIVE IMPAIRMENTS: decreased activity tolerance, decreased balance, decreased mobility, decreased ROM, decreased strength, hypomobility, impaired flexibility, impaired sensation, impaired tone, and pain.   ACTIVITY LIMITATIONS: lifting and bending  PARTICIPATION LIMITATIONS: cleaning and shopping  PERSONAL FACTORS: Age, Past/current experiences, Time since onset of injury/illness/exacerbation, and 3+ comorbidities: Allergies, hypertension, osteoarthritis, anxiety, cataracts, and chronic low back pain  are also affecting patient's functional outcome.   REHAB POTENTIAL: Fair    CLINICAL DECISION MAKING: Evolving/moderate complexity  EVALUATION COMPLEXITY: Moderate   GOALS: Goals reviewed with patient? Yes  LONG TERM GOALS: Target date: 09/24/23  Patient will be independent with her HEP.  Baseline:  Goal status: INITIAL  2.  Patient will be able to complete her daily activities without her familiar symptoms exceeding 5/10.  Baseline:  Goal status: INITIAL  3.  Patient will be able to demonstrate at least 30 degrees of lumbar flexion for improved function picking up items from the floor.  Baseline:  Goal status: INITIAL  4.  Patient will improve her timed up and go time to 12 seconds or less to reduce her fall risk.  Baseline:  Goal status: INITIAL  5.  Patient will improve her five time sit to stand time to 20 seconds or less for improved lower extremity power.  Baseline:  Goal status: INITIAL  PLAN:  PT FREQUENCY: 1-2x/week  PT DURATION: 4 weeks  PLANNED INTERVENTIONS: 97164- PT Re-evaluation, 97110-Therapeutic exercises, 97530- Therapeutic activity, 97112- Neuromuscular re-education, 97535- Self Care, 16109- Manual therapy, 97014- Electrical stimulation (unattended), 97035- Ultrasound, Patient/Family education, Balance training, Stair training, Joint mobilization, Spinal mobilization, Cryotherapy, and  Moist heat.  PLAN FOR NEXT SESSION: Nustep, lumbar and lower extremity strengthening, balance interventions, or modalities as needed   Granville Lewis, PT 08/27/2023, 5:35 PM

## 2023-09-01 ENCOUNTER — Ambulatory Visit: Payer: Medicare Other

## 2023-09-01 DIAGNOSIS — Z9181 History of falling: Secondary | ICD-10-CM | POA: Diagnosis not present

## 2023-09-01 DIAGNOSIS — M5416 Radiculopathy, lumbar region: Secondary | ICD-10-CM | POA: Diagnosis not present

## 2023-09-01 NOTE — Therapy (Signed)
 OUTPATIENT PHYSICAL THERAPY THORACOLUMBAR EVALUATION   Patient Name: Misty Newman MRN: [id number] DOB:[birth year], 77 y.o., female Today's Date: 09/01/2023  END OF SESSION:  PT End of Session - 09/01/23 1306     Visit Number 2    Number of Visits 7    Date for PT Re-Evaluation 10/30/23    PT Start Time 1300    PT Stop Time 1341    PT Time Calculation (min) 41 min    Activity Tolerance Patient tolerated treatment well    Behavior During Therapy [facility] for tasks assessed/performed             Past Medical History:  Diagnosis Date   Allergy    seasonal   Anxiety    Arthritis    Cataract    removal bil   Essential hypertension    Heart murmur    a. 01/2016 Echo: EF 65-70%, no rwma, Gr1 DD, mild MR/TR, mild to mod PR, PASP .   History of chronic back pain    Hyperlipidemia    Hypothyroidism    Osteoarthritis    Palpitations    Past Surgical History:  Procedure Laterality Date   ABDOMINAL HYSTERECTOMY     ANTERIOR FUSION CERVICAL SPINE     3 cervical fusions   APPENDECTOMY     BREAST EXCISIONAL BIOPSY Right    benign   CHOLECYSTECTOMY     COLONOSCOPY     TONSILLECTOMY AND ADENOIDECTOMY     Patient Active Problem List   Diagnosis Date Noted   Unilateral primary osteoarthritis, right knee 01/06/2018   Palpitations    Essential hypertension 01/27/2016   Hypothyroidism 01/27/2016   Right bundle branch block 01/27/2016   Hyperlipidemia LDL goal <70 01/27/2016   Bilateral lower extremity edema 01/27/2016    PCP: Royden Ronal Czar, FNP  REFERRING PROVIDER: Jennetta Gerard BIRCH, NP   REFERRING DIAG: Lumbar radiculopathy, chronic  Rationale for Evaluation and Treatment: Rehabilitation  THERAPY DIAG:  Radiculopathy, lumbar region  History of falling  ONSET DATE: November 2023  SUBJECTIVE:                                                                                                                                                                                            SUBJECTIVE STATEMENT: Pt reports 8/10 low back pain today.  Pt reports that she will be getting fluid taken off both knees next week.   PERTINENT HISTORY:  Allergies, hypertension, osteoarthritis, anxiety, cataracts, and chronic low back pain  PAIN:  Are you having pain? Yes: NPRS scale: 8/10 Pain location: low back and left leg to the knee  Pain  description: constant soreness Aggravating factors: rolling over in bed and bending over Relieving factors: medication  PRECAUTIONS: Fall  RED FLAGS: None   WEIGHT BEARING RESTRICTIONS: No  FALLS:  Has patient fallen in last 6 months? Yes. Number of falls 1  LIVING ENVIRONMENT: Lives with: lives alone Lives in: House/apartment Stairs: Yes: External: 1 steps; none Has following equipment at home: None  OCCUPATION: retired  PLOF: Independent  PATIENT GOALS: reduced pain  NEXT MD VISIT: 09/17/23 (approximately)  OBJECTIVE:  Note: Objective measures were completed at Evaluation unless otherwise noted.  COGNITION: Overall cognitive status: Within functional limits for tasks assessed     SENSATION: Light touch: patient reports diminished sensation in bilateral lower extremities, but unable to accurately assess due to patient opening her eyes throughout the assessment Patient reports numbness in both feet with the left being worse than the right.   PALPATION: TTP: left lumbar paraspinals, QL, and L5 spinous processes with increased tone noted in bilateral lumbar paraspinals and QL  LUMBAR JOINT MOBILITY:  L1-4: hypomobile and nonpainful   L4-5: hypomobile and painful   LUMBAR ROM:   AROM eval  Flexion 20; prior to knee flexion  Extension 10; minimal lumbar mobility  Right lateral flexion Patient unable to isolate lumbar side bending  Left lateral flexion Patient unable to isolate lumbar side bending  Right rotation 25% limited  Left rotation 25% limited; pain and pulling in left hip   (Blank  rows = not tested)  LOWER EXTREMITY ROM:  WFL for activities assessed  LOWER EXTREMITY MMT:    MMT Right eval Left eval  Hip flexion 4-/5 3+/5  Hip extension    Hip abduction    Hip adduction    Hip internal rotation    Hip external rotation    Knee flexion 4+/5 4-/5  Knee extension 4/5 4/5  Ankle dorsiflexion 3+/5 3+/5  Ankle plantarflexion    Ankle inversion    Ankle eversion     (Blank rows = not tested)  FUNCTIONAL TESTS:  5 times sit to stand: 32.13 seconds without upper extremity support Timed up and go (TUG): 14.07 seconds  GAIT: Assistive device utilized: None Level of assistance: Complete Independence  TREATMENT DATE:                                                                                                                        09/01/23                      EXERCISE LOG  Exercise Repetitions and Resistance Comments  Nustep  Lvl 2 x 10 mins   [employer] 3 mins   [employer] 3 mins   LAQs 2# x 20 reps bil   Seated Marches 2# x 20 reps bil   Seated Hip Abduction Red x 3 mins   Seated Hip Adduction 3 mins   Seated Ham Curls Red x 20 reps bil    Blank cell = exercise not  performed today   PATIENT EDUCATION:  Education details: plan of care, prognosis, objective findings, healing, and goals for therapy Person educated: Patient Education method: Explanation Education comprehension: verbalized understanding  HOME EXERCISE PROGRAM:   ASSESSMENT:  CLINICAL IMPRESSION: Pt arrives for today's treatment session reporting 8/10 low back pain and bil knee pain.  Pt reports that she has an appointment next week to get fluid removed from bil knees and hopes that will help with performing exercises.  Pt instructed in several seated and standing exercises today to increase strength, function, and ROM while decreasing pain.  Pt declined use of estim today, but says she will think about it for next treatment session.  Pt denied any change in pain at completion of  today's treatment session.    OBJECTIVE IMPAIRMENTS: decreased activity tolerance, decreased balance, decreased mobility, decreased ROM, decreased strength, hypomobility, impaired flexibility, impaired sensation, impaired tone, and pain.   ACTIVITY LIMITATIONS: lifting and bending  PARTICIPATION LIMITATIONS: cleaning and shopping  PERSONAL FACTORS: Age, Past/current experiences, Time since onset of injury/illness/exacerbation, and 3+ comorbidities: Allergies, hypertension, osteoarthritis, anxiety, cataracts, and chronic low back pain  are also affecting patient's functional outcome.   REHAB POTENTIAL: Fair    CLINICAL DECISION MAKING: Evolving/moderate complexity  EVALUATION COMPLEXITY: Moderate   GOALS: Goals reviewed with patient? Yes  LONG TERM GOALS: Target date: 09/24/23  Patient will be independent with her HEP.  Baseline:  Goal status: INITIAL  2.  Patient will be able to complete her daily activities without her familiar symptoms exceeding 5/10.  Baseline:  Goal status: INITIAL  3.  Patient will be able to demonstrate at least 30 degrees of lumbar flexion for improved function picking up items from the floor.  Baseline:  Goal status: INITIAL  4.  Patient will improve her timed up and go time to 12 seconds or less to reduce her fall risk.  Baseline:  Goal status: INITIAL  5.  Patient will improve her five time sit to stand time to 20 seconds or less for improved lower extremity power.  Baseline:  Goal status: INITIAL  PLAN:  PT FREQUENCY: 1-2x/week  PT DURATION: 4 weeks  PLANNED INTERVENTIONS: 97164- PT Re-evaluation, 97110-Therapeutic exercises, 97530- Therapeutic activity, 97112- Neuromuscular re-education, 97535- Self Care, 02859- Manual therapy, 97014- Electrical stimulation (unattended), 97035- Ultrasound, Patient/Family education, Balance training, Stair training, Joint mobilization, Spinal mobilization, Cryotherapy, and Moist heat.  PLAN FOR NEXT  SESSION: Nustep, lumbar and lower extremity strengthening, balance interventions, or modalities as needed   Delon DELENA Gosling, PTA 09/01/2023, 1:43 PM

## 2023-09-03 ENCOUNTER — Ambulatory Visit: Payer: Medicare Other | Admitting: Physician Assistant

## 2023-09-07 ENCOUNTER — Ambulatory Visit: Payer: Medicare Other | Admitting: Physician Assistant

## 2023-09-07 DIAGNOSIS — M25561 Pain in right knee: Secondary | ICD-10-CM | POA: Diagnosis not present

## 2023-09-07 DIAGNOSIS — M25562 Pain in left knee: Secondary | ICD-10-CM | POA: Diagnosis not present

## 2023-09-07 MED ORDER — LIDOCAINE HCL 1 % IJ SOLN
4.0000 mL | INTRAMUSCULAR | Status: AC | PRN
Start: 2023-09-07 — End: 2023-09-07
  Administered 2023-09-07: 4 mL

## 2023-09-07 NOTE — Progress Notes (Signed)
 Office Visit Note   [name]           Date of Birth: [birth year]           MRN: [id number] Visit Date: 09/07/2023              Requested by: Royden Ronal Czar, FNP [address] [place],  [place] [address] PCP: Royden Ronal Czar, FNP  Chief Complaint  Patient presents with  . Right Knee - Pain  . Left Knee - Pain      HPI: Misty Newman is a pleasant 78 year old woman who normally sees Marvis Gaskins.  She has a history of bilateral knee arthritis.  I actually last saw her when she fell in December about a month ago onto the front of her right knee.  She comes in today requesting aspiration of her left knee as well as possible aspiration of the right knee.  Assessment & Plan: Visit Diagnoses:  1. Acute pain of right knee     Plan: I was able to aspirate 40 cc of clear fluid from the left knee.  As far as the right knee it did not have any fluid as I suspected but I did attempt aspiration she does have quite a bit of pain in the anterior knee.  No evidence of infection.  I would like for her to get a CT scan because she still having significant difficulty to rule out an occult fracture  Follow-Up Instructions: With Tory Beers Exam  Patient is alert, oriented, no adenopathy, well-dressed, normal affect, normal respiratory effort. Right knee she does have some swelling especially anterior tender to palpation she has no effusion she has full range of motion compartments are soft and nontender no particular tenderness over the joint line.  Negative Homans' sign no erythema Left knee she is neurovascular intact she has good flexion extension she does have a moderate effusion.  No redness no instability compartments are soft and nontender  Imaging: No results found. No images are attached to the encounter.  Labs: Lab Results  Component Value Date   HGBA1C  07/01/2008    5.7 (NOTE)   The ADA recommends the following therapeutic goal for glycemic    control related to Hgb A1C measurement:   Goal of Therapy:   < 7.0% Hgb A1C   Reference: American Diabetes Association: Clinical Practice   Recommendations 2008, Diabetes Care,  2008, 31:(Suppl 1).     Lab Results  Component Value Date   ALBUMIN 4.6 02/16/2019   ALBUMIN 4.0 06/30/2008    Lab Results  Component Value Date   MG 1.8 04/28/2018   MG 2.1 07/01/2008   MG 2.1 06/30/2008   No results found for: VD25OH  No results found for: PREALBUMIN    Latest Ref Rng & Units 05/16/2020    2:49 PM 01/10/2016    6:22 AM 07/01/2008    6:00 AM  CBC EXTENDED  WBC 4.0 - 10.5 K/uL 12.0  11.3  8.9   RBC 3.87 - 5.11 Mil/uL 4.17  3.96  4.23   Hemoglobin 12.0 - 15.0 g/dL 87.6  88.3  87.2   HCT 36.0 - 46.0 % 36.9  35.4  38.1   Platelets 150.0 - 400.0 K/uL 298.0  267  247   NEUT# 1.4 - 7.7 K/uL 7.8  8.8    Lymph# 0.7 - 4.0 K/uL 3.0  1.9       There is no height or weight on file to  calculate BMI.  Orders:  Orders Placed This Encounter  Procedures  . CT KNEE RIGHT WO CONTRAST   No orders of the defined types were placed in this encounter.    Procedures: Large Joint Inj: L knee on 09/07/2023 2:07 PM Indications: pain and diagnostic evaluation Details: 25 G 1.5 in needle, superolateral approach  Arthrogram: No  Medications: 4 mL lidocaine  1 % Outcome: tolerated well, no immediate complications  After verbal consent was obtained both the left and right knee were prepped with alcohol followed by Betadine.  4 cc of lidocaine  was injected.  After adequate analgesia 40 cc of clear yellow fluid was aspirated from the left knee.  Was unable to aspirate any fluid from the right knee.  No steroid was used as she recently had steroid less than 3 months ago Procedure, treatment alternatives, risks and benefits explained, specific risks discussed. Consent was given by the patient.    Clinical Data: No additional findings.  ROS:  All other systems negative, except as noted in the  HPI. Review of Systems  Objective: Vital Signs: There were no vitals taken for this visit.  Specialty Comments:  No specialty comments available.  PMFS History: Patient Active Problem List   Diagnosis Date Noted  . Unilateral primary osteoarthritis, right knee 01/06/2018  . Palpitations   . Essential hypertension 01/27/2016  . Hypothyroidism 01/27/2016  . Right bundle branch block 01/27/2016  . Hyperlipidemia LDL goal <70 01/27/2016  . Bilateral lower extremity edema 01/27/2016   Past Medical History:  Diagnosis Date  . Allergy    seasonal  . Anxiety   . Arthritis   . Cataract    removal bil  . Essential hypertension   . Heart murmur    a. 01/2016 Echo: EF 65-70%, no rwma, Gr1 DD, mild MR/TR, mild to mod PR, PASP .  SABRA History of chronic back pain   . Hyperlipidemia   . Hypothyroidism   . Osteoarthritis   . Palpitations     Family History  Problem Relation Age of Onset  . Colon polyps Sister   . Colon cancer Sister 53  . Esophageal cancer Neg Hx   . Pancreatic cancer Neg Hx   . Prostate cancer Neg Hx   . Rectal cancer Neg Hx   . Stomach cancer Neg Hx     Past Surgical History:  Procedure Laterality Date  . ABDOMINAL HYSTERECTOMY    . ANTERIOR FUSION CERVICAL SPINE     3 cervical fusions  . APPENDECTOMY    . BREAST EXCISIONAL BIOPSY Right    benign  . CHOLECYSTECTOMY    . COLONOSCOPY    . TONSILLECTOMY AND ADENOIDECTOMY     Social History   Occupational History  . Not on file  Tobacco Use  . Smoking status: Never  . Smokeless tobacco: Never  Vaping Use  . Vaping status: Never Used  Substance and Sexual Activity  . Alcohol use: No  . Drug use: No  . Sexual activity: Not on file

## 2023-09-08 DIAGNOSIS — E7800 Pure hypercholesterolemia, unspecified: Secondary | ICD-10-CM | POA: Diagnosis not present

## 2023-09-08 DIAGNOSIS — E1151 Type 2 diabetes mellitus with diabetic peripheral angiopathy without gangrene: Secondary | ICD-10-CM | POA: Diagnosis not present

## 2023-09-08 DIAGNOSIS — I10 Essential (primary) hypertension: Secondary | ICD-10-CM | POA: Diagnosis not present

## 2023-09-08 LAB — LAB REPORT - SCANNED
A1c: 7.1
EGFR: 68

## 2023-09-09 ENCOUNTER — Ambulatory Visit: Payer: Medicare Other

## 2023-09-15 DIAGNOSIS — I10 Essential (primary) hypertension: Secondary | ICD-10-CM | POA: Diagnosis not present

## 2023-09-15 DIAGNOSIS — E1151 Type 2 diabetes mellitus with diabetic peripheral angiopathy without gangrene: Secondary | ICD-10-CM | POA: Diagnosis not present

## 2023-09-15 DIAGNOSIS — E7800 Pure hypercholesterolemia, unspecified: Secondary | ICD-10-CM | POA: Diagnosis not present

## 2023-09-15 DIAGNOSIS — E039 Hypothyroidism, unspecified: Secondary | ICD-10-CM | POA: Diagnosis not present

## 2023-09-15 DIAGNOSIS — E1165 Type 2 diabetes mellitus with hyperglycemia: Secondary | ICD-10-CM | POA: Diagnosis not present

## 2023-09-17 DIAGNOSIS — M5416 Radiculopathy, lumbar region: Secondary | ICD-10-CM | POA: Diagnosis not present

## 2023-09-18 ENCOUNTER — Other Ambulatory Visit: Payer: Self-pay | Admitting: Student

## 2023-09-18 DIAGNOSIS — M5416 Radiculopathy, lumbar region: Secondary | ICD-10-CM

## 2023-09-25 ENCOUNTER — Ambulatory Visit
Admission: RE | Admit: 2023-09-25 | Discharge: 2023-09-25 | Disposition: A | Discharge status: Home / Self Care | Payer: Medicare Other | Source: Ambulatory Visit | Attending: Physician Assistant | Admitting: Physician Assistant

## 2023-09-25 DIAGNOSIS — M25461 Effusion, right knee: Secondary | ICD-10-CM | POA: Diagnosis not present

## 2023-09-25 DIAGNOSIS — M25561 Pain in right knee: Secondary | ICD-10-CM

## 2023-09-25 DIAGNOSIS — M1711 Unilateral primary osteoarthritis, right knee: Secondary | ICD-10-CM | POA: Diagnosis not present

## 2023-09-25 DIAGNOSIS — R600 Localized edema: Secondary | ICD-10-CM | POA: Diagnosis not present

## 2023-10-01 ENCOUNTER — Encounter: Payer: Self-pay | Admitting: Physician Assistant

## 2023-10-01 ENCOUNTER — Other Ambulatory Visit (INDEPENDENT_AMBULATORY_CARE_PROVIDER_SITE_OTHER): Payer: Medicare Other

## 2023-10-01 ENCOUNTER — Ambulatory Visit: Payer: Medicare Other | Admitting: Physician Assistant

## 2023-10-01 DIAGNOSIS — M7062 Trochanteric bursitis, left hip: Secondary | ICD-10-CM

## 2023-10-01 DIAGNOSIS — M1711 Unilateral primary osteoarthritis, right knee: Secondary | ICD-10-CM

## 2023-10-01 MED ORDER — LIDOCAINE HCL 1 % IJ SOLN
3.0000 mL | INTRAMUSCULAR | Status: AC | PRN
Start: 2023-10-01 — End: 2023-10-01
  Administered 2023-10-01: 3 mL

## 2023-10-01 MED ORDER — METHYLPREDNISOLONE ACETATE 40 MG/ML IJ SUSP
40.0000 mg | INTRAMUSCULAR | Status: AC | PRN
Start: 2023-10-01 — End: 2023-10-01
  Administered 2023-10-01: 40 mg via INTRA_ARTICULAR

## 2023-10-01 NOTE — Progress Notes (Signed)
HPI: Misty Newman comes in today to review the CT scan of her right knee.  She was sent for CT scan to rule out occult fracture due to a fall where she tripped over her dog bowl falling directly on her right knee.  She has known osteoarthritis in the knee.  She continues to have pain in the knee and feels she may have some fluid on the knee.  She is also having left hip pain.  Some pain buttocks region.  She is seeing Dr. Dutch Quint for workup of her back due to some pain that radiates down to her left knee.  She is concerned that she may have a hip problem is asking for [place] to look at this also today. CT scan right knee without contrast dated 09/25/2023 shows no acute fractures no acute findings.  Slight effusion.  Tricompartmental arthritic changes.  Small intra-articular loose bodies are seen.  No evidence of osteonecrosis.  Review of systems see HPI otherwise negative or noncontributory.  Physical exam: General well-developed well-nourished female who ambulates without any assistive devices is able to get on and off the exam table on her own. Psych: Alert and oriented x 3 Bilateral knees good range of motion both knees no abnormal warmth or erythema of either knee.  Both knees with slight effusion.  No instability of either knee. Bilateral hips good range of motion of both hips without significant pain.  Tenderness over the left hip trochanteric region.  Radiographs: AP pelvis lateral view left hip: No acute fractures.  Both hips are well located.  Osteophytes consistent with pincer type impingement bilateral hips.  Otherwise joint spaces well-maintained.  No bony abnormalities otherwise.   Impression: Right knee tricompartmental arthritis Left hip trochanteric bursitis   Plan: She would like to undergo aspiration injection of the right knee today.  Therefore we will proceed with an injection and aspiration of the knee.  In regards to the left hip she will work on IT band stretching as shown.  Follow-up  with [place] as needed.  Questions encouraged and answered at length.    Procedure Note  [name]             Date of Birth: [birth year]           MRN: [id number]             Visit Date: 10/01/2023  Procedures: Visit Diagnoses:  1. Trochanteric bursitis, left hip   2. Unilateral primary osteoarthritis, right knee     Large Joint Inj: R knee on 10/01/2023 4:47 PM Indications: pain Details: 22 G 1.5 in needle, superolateral approach  Arthrogram: No  Medications: 3 mL lidocaine 1 %; 40 mg methylPREDNISolone acetate 40 MG/ML Aspirate: 10 mL yellow Outcome: tolerated well, no immediate complications Procedure, treatment alternatives, risks and benefits explained, specific risks discussed. Consent was given by the patient. Immediately prior to procedure a time out was called to verify the correct patient, procedure, equipment, support staff and site/side marked as required. Patient was prepped and draped in the usual sterile fashion.

## 2023-10-12 ENCOUNTER — Ambulatory Visit: Payer: Medicare Other | Attending: Cardiovascular Disease | Admitting: Cardiovascular Disease

## 2023-10-12 ENCOUNTER — Encounter: Payer: Self-pay | Admitting: Cardiovascular Disease

## 2023-10-12 VITALS — BP 138/88 | HR 80 | Ht 67.0 in | Wt 177.8 lb

## 2023-10-12 DIAGNOSIS — I10 Essential (primary) hypertension: Secondary | ICD-10-CM

## 2023-10-12 DIAGNOSIS — E782 Mixed hyperlipidemia: Secondary | ICD-10-CM | POA: Diagnosis not present

## 2023-10-12 DIAGNOSIS — Z01810 Encounter for preprocedural cardiovascular examination: Secondary | ICD-10-CM

## 2023-10-12 DIAGNOSIS — R002 Palpitations: Secondary | ICD-10-CM | POA: Diagnosis not present

## 2023-10-12 DIAGNOSIS — E785 Hyperlipidemia, unspecified: Secondary | ICD-10-CM

## 2023-10-12 DIAGNOSIS — I5032 Chronic diastolic (congestive) heart failure: Secondary | ICD-10-CM | POA: Diagnosis not present

## 2023-10-12 DIAGNOSIS — I4510 Unspecified right bundle-branch block: Secondary | ICD-10-CM

## 2023-10-12 DIAGNOSIS — E039 Hypothyroidism, unspecified: Secondary | ICD-10-CM

## 2023-10-12 NOTE — Patient Instructions (Addendum)
 Medication Instructions:  No medication changes were made during today's visit  *If you need a refill on your cardiac medications before your next appointment, please call your pharmacy*   Lab Work: No labs were ordered during today's visit.  If you have labs (blood work) drawn today and your tests are completely normal, you will receive your results only by: MyChart Message (if you have MyChart) OR A paper copy in the mail If you have any lab test that is abnormal or we need to change your treatment, we will call you to review the results.   Testing/Procedures: Your physician has requested that you have an echocardiogram. Echocardiography is a painless test that uses sound waves to create images of your heart. It provides your doctor with information about the size and shape of your heart and how well your heart's chambers and valves are working. This procedure takes approximately one hour. There are no restrictions for this procedure. Please do NOT wear cologne, perfume, aftershave, or lotions (deodorant is allowed). Please arrive 15 minutes prior to your appointment time.  Please note: We ask at that you not bring children with you during ultrasound (echo/ vascular) testing. Due to room size and safety concerns, children are not allowed in the ultrasound rooms during exams. Our front office staff cannot provide observation of children in our lobby area while testing is being conducted. An adult accompanying a patient to their appointment will only be allowed in the ultrasound room at the discretion of the ultrasound technician under special circumstances. We apologize for any inconvenience.    Follow-Up: At [facility], you and your health needs are our priority.  As part of our continuing mission to provide you with exceptional heart care, we have created designated Provider Care Teams.  These Care Teams include your primary Cardiologist (physician) and Advanced Practice Providers  (APPs -  Physician Assistants and Nurse Practitioners) who all work together to provide you with the care you need, when you need it.  We recommend signing up for the patient portal called "MyChart".  Sign up information is provided on this After Visit Summary.  MyChart is used to connect with patients for Virtual Visits (Telemedicine).  Patients are able to view lab/test results, encounter notes, upcoming appointments, etc.  Non-urgent messages can be sent to your provider as well.   To learn more about what you can do with MyChart, go to [email or web address].    Your next appointment:   1 year(s)  Provider:   Marlana Silvan, NP        Other Instructions Thank you for choosing [facility]!      A letter will be mailed to you as a reminder to call the office for your follow up appointment.

## 2023-10-12 NOTE — Progress Notes (Signed)
Patient ID: Misty Newman, female   DOB: 1946-07-06, 78 y.o.   MRN: [id number]        Primary MD: Lauretta Chester, FNP  PATIENT PROFILE: Misty Newman is a 78 y.o. female who presents for an 62 month f/u cardiology.  HPI:  [name] has a history of hypertension, hyperlipidemia, thyroid disease, mitral valve prolapse, and palpitations.  Remotely, she had been on Ramipril for blood pressure control, and also had Maxide.  She was  evaluated in the emergency room on 01/10/2016 when after she was awakened by her husband from sleep.  She began to notice palpitations.  Her initial ECG had shown sinus tachycardia at 117 bpm with right bundle branch block and inferior T-wave abnormality.  She admits to chronic swelling in her ankles bilaterally.  She has issues with arthritis involving her back and has been taking Voltaren 75 mg daily.  She is a history of hypothyroidism and has been on thyroid replacement with levothyroxine 100 g.  I last saw her in May 2017 she was on  metoprolol 100 mg twice a day for her palpitations and a long time ago was also started on Lanoxin by Dr. Alanda Amass.  She has a history of hyperlipidemia was on Crestor 10 mg.    In 2017, she was given a trial of valsartan but apparently her blood pressure became low and this was discontinued.    When she saw Azalee Course, [facility] in August 2019, she had blood pressure lability.  He recommended that she obtain daily monitor of her blood pressures.  She had blood pressure lability and at times her blood pressures was 180s but at home most of the time her blood pressures in the 130s with diastolics in the 70s.  She was taking metoprolol tartrate 100 mg twice a day, Lanoxin 0.125 mg daily, triamterene HCT 37.5/25 mg every other day and although it was not on her medical record she has been taking ramipril 10 mg twice a day.  She has had issues over the past year with diverticulitis and a tooth abscess.  She underwent a root canal and also has  required a dental extraction.    I saw her in December 2019.  At that time I recommended that she discontinue digoxin and reduce ramipril from 10 mg twice a day down to 10 mg daily.  Her resting pulse was in the 90s and with her blood pressure elevation I added Cardizem CD 120 mg at bedtime.  She has chronic right bundle branch block.  I scheduled her for an echo Doppler study which was done on September 23, 2018 which demonstrated an EF of 55 to 60%.  She had normal wall motion.  There was grade 2 diastolic dysfunction.  Her left atrium was mildly dilated.  She was  evaluated on February 15, 2019 by Micah Flesher, PA in a telemedicine visit.  She apparently was not taking the Cardizem but could not remember the reason why she could not tolerate it.  Apparently she never changed her digoxin or ramipril was still on the prior dosing.  I saw her on 2019-03-11.  She admits to being under considerable stress.  Her husband died this past 2023/11/01 on 03-06-2019 and today on 03-11-19 her brother died.  She has been off digoxin since June 16 and actually feels better.  She admits to trace ankle swelling which is insignificant.  During that evaluation, her blood pressure was elevated and  with previous intolerance to Cardizem I attempted a trial of amlodipine initiating at 5 mg at bedtime.  She continued to be on rosuvastatin 10 mg for hyperlipidemia, levothyroxine 100 mcg for hypothyroidism and she was successful with recent weight loss down from 161 to 149 pounds.  Since I last saw her, she has been evaluated by Bernadene Person, NP and saw her on September 29, 2022.  She was stable from a cardiovascular standpoint and had noticed some palpitations which had been controlled on metoprolol but were felt by her primary physician due to anxiety.  Presently, [name] feels well.  She is followed by Aldine Contes at [facility] since Dr. Marylen Ponto retirement.  She may ultimately need knee surgery with Dr. Magnus Ivan.   She had previously broken her foot and admits to residual right feet swelling.  She denies chest pain or shortness of breath.  She has not had any palpitations.  She continues to be on metoprolol tartrate 100 mg twice a day, ramipril 5 mg, and Maxide.  She is on levothyroxine 88 mcg for hypothyroidism and takes omega-3 fatty acid.  She presents for evaluation.   Past Medical History:  Diagnosis Date   Allergy    seasonal   Anxiety    Arthritis    Cataract    removal bil   Essential hypertension    Heart murmur    a. 01/2016 Echo: EF 65-70%, no rwma, Gr1 DD, mild MR/TR, mild to mod PR, PASP .   History of chronic back pain    Hyperlipidemia    Hypothyroidism    Osteoarthritis    Palpitations     Past Surgical History:  Procedure Laterality Date   ABDOMINAL HYSTERECTOMY     ANTERIOR FUSION CERVICAL SPINE     3 cervical fusions   APPENDECTOMY     BREAST EXCISIONAL BIOPSY Right    benign   CHOLECYSTECTOMY     COLONOSCOPY     TONSILLECTOMY AND ADENOIDECTOMY      Allergies  Allergen Reactions   Butorphanol Other (See Comments)   Butorphanol Tartrate     REACTION: sweating, increased heartrate   Cardizem [Diltiazem Hcl] Other (See Comments)    Per pt cause b/p to drop low   Clarithromycin     Can't remember   Meperidine Hcl     REACTION: itching     Naloxone     Pt questions allergies, is unaware of ever having, does not sound familiar   Other Other (See Comments)   Penicillins     REACTION: unspecified- as a child   Sulfa Antibiotics    Sulfonamide Derivatives     REACTION: does not remember    Current Outpatient Medications  Medication Sig Dispense Refill   cyclobenzaprine (FLEXERIL) 10 MG tablet Take 10 mg by mouth daily.     diclofenac (VOLTAREN) 75 MG EC tablet Take 75 mg by mouth daily.     fish oil-omega-3 fatty acids 1000 MG capsule Take 1 g by mouth daily.     Lancets (ONETOUCH DELICA PLUS LANCET33G) MISC TEST ONCE A DAY AS DIRECTED      levothyroxine (SYNTHROID, LEVOTHROID) 100 MCG tablet Take 88 mcg by mouth daily.     metoprolol tartrate (LOPRESSOR) 100 MG tablet Take 1 tablet (100 mg total) by mouth 2 (two) times daily. 30 tablet 5   Misc Natural Products (OSTEO BI-FLEX/5-LOXIN ADVANCED PO) Take by mouth 2 (two) times daily.     ONETOUCH VERIO test strip USE TO  TEST SUGAR 3 TIMES DAILY     ramipril (ALTACE) 5 MG capsule Take 5 mg by mouth daily.     rosuvastatin (CRESTOR) 20 MG tablet Take 10 mg by mouth daily.      triamterene-hydrochlorothiazide (MAXZIDE-25) 37.5-25 MG tablet Take 1 tablet by mouth every other day.     budesonide (ENTOCORT EC) 3 MG 24 hr capsule TAKE 3 CAPSULES (9 MG TOTAL) BY MOUTH ONCE DAILY. (Patient not taking: Reported on 10/12/2023) 90 capsule 1   cholecalciferol (VITAMIN D3) 25 MCG (1000 UT) tablet Take 1,000 Units by mouth daily. (Patient not taking: Reported on 10/12/2023)     clotrimazole-betamethasone (LOTRISONE) cream Apply 1 Application topically daily. (Patient not taking: Reported on 10/12/2023) 30 g 0   dicyclomine (BENTYL) 10 MG capsule TAKE 1 CAPSULE (10 MG TOTAL) BY MOUTH EVERY 8 (EIGHT) HOURS AS NEEDED (CRAMPS AND DIARRHEA). (Patient not taking: Reported on 10/12/2023) 270 capsule 1   diphenoxylate-atropine (LOMOTIL) 2.5-0.025 MG tablet Take 1 tablet by mouth 4 (four) times daily as needed for diarrhea or loose stools. (Patient not taking: Reported on 10/12/2023) 30 tablet 1   gabapentin (NEURONTIN) 300 MG capsule Take 1 capsule (300 mg total) by mouth at bedtime. (Patient not taking: Reported on 10/12/2023) 30 capsule 1   lansoprazole (PREVACID) 30 MG capsule Take 30 mg by mouth daily at 12 noon. (Patient not taking: Reported on 10/12/2023)     No current facility-administered medications for this visit.    Social History   Socioeconomic History   Marital status: Married    Spouse name: Not on file   Number of children: Not on file   Years of education: Not on file   Highest education  level: Not on file  Occupational History   Not on file  Tobacco Use   Smoking status: Never   Smokeless tobacco: Never  Vaping Use   Vaping status: Never Used  Substance and Sexual Activity   Alcohol use: No   Drug use: No   Sexual activity: Not on file  Other Topics Concern   Not on file  Social History Narrative   Not on file   Social Drivers of Health   Financial Resource Strain: Not on file  Food Insecurity: Not on file  Transportation Needs: Not on file  Physical Activity: Not on file  Stress: Not on file  Social Connections: Not on file  Intimate Partner Violence: Not on file   Additional social history is notable in that she was married for 50 years.  She has been widowed since February 26, 2019 . She does not have any children.  She is retired.  She completed 12 grade.  He watches a 38-year-old 3 days per week.  There is no history of tobacco use or alcohol use.  She does not exercise.  Family History  Problem Relation Age of Onset   Colon polyps Sister    Colon cancer Sister 13   Esophageal cancer Neg Hx    Pancreatic cancer Neg Hx    Prostate cancer Neg Hx    Rectal cancer Neg Hx    Stomach cancer Neg Hx    Additional family history is notable in that her mother died at age 64.  Her father died at age 51 and had several strokes.  She has a sister who died at age 9 and also had a stroke.  ROS General: Negative; No fevers, chills, or night sweats HEENT: Negative; No changes in vision or hearing, sinus congestion,  difficulty swallowing Pulmonary: Negative; No cough, wheezing, shortness of breath, hemoptysis Cardiovascular:  See HPI. GI: Negative; No nausea, vomiting, diarrhea, or abdominal pain GU: Negative; No dysuria, hematuria, or difficulty voiding Musculoskeletal: Positive for arthritis of her back; knee discomfort, history of broken right foot.. Hematologic/Oncologic: Negative; no easy bruising, bleeding Endocrine: Positive for hypothyroidism no  diabetes Neuro: Negative; no changes in balance, headaches Skin: Negative; No rashes or skin lesions Psychiatric: Negative; No behavioral problems, depression Sleep: Negative; No daytime sleepiness, hypersomnolence, bruxism, restless legs, hypnogagnic hallucinations Other comprehensive 14 point system review is negative   Physical Exam BP 138/88   Pulse 80   Ht 5\' 7"  (1.702 m)   Wt 177 lb 12.8 oz (80.6 kg)   SpO2 98%   BMI 27.85 kg/m    Repeat blood pressure by me 142/76  Wt Readings from Last 3 Encounters:  10/12/23 177 lb 12.8 oz (80.6 kg)  08/12/23 175 lb (79.4 kg)  03/16/23 167 lb (75.8 kg)   General: Alert, oriented, no distress.  Skin: normal turgor, no rashes, warm and dry HEENT: Normocephalic, atraumatic. Pupils equal round and reactive to light; sclera anicteric; extraocular muscles intact;  Nose without nasal septal hypertrophy Mouth/Parynx benign; Mallinpatti scale 3 Neck: No JVD, no carotid bruits; normal carotid upstroke Lungs: clear to ausculatation and percussion; no wheezing or rales Chest wall: without tenderness to palpitation Heart: PMI not displaced, RRR, s1 s2 normal, 1/6 systolic murmur, no diastolic murmur, no rubs, gallops, thrills, or heaves Abdomen: soft, nontender; no hepatosplenomehaly, BS+; abdominal aorta nontender and not dilated by palpation. Back: no CVA tenderness Pulses 2+ Musculoskeletal: full range of motion, normal strength, no joint deformities Extremities: Trivial ankle swelling no clubbing cyanosis or , Homan's sign negative  Neurologic: grossly nonfocal; Cranial nerves grossly wnl Psychologic: Normal mood and affect  EKG Interpretation Date/Time:  Monday October 12 2023 11:48:51 EST Ventricular Rate:  80 PR Interval:  192 QRS Duration:  132 QT Interval:  406 QTC Calculation: 468 R Axis:   48  Text Interpretation: Normal sinus rhythm Right bundle branch block When compared with ECG of 10-Jan-2016 05:43, ST no longer depressed  in Inferior leads T wave inversion less evident in Inferior leads Confirmed by Nicki Guadalajara ([id number]) on 10/15/2023 3:55:22 PM    March 03, 2019 ECG (independently read by me): Normal sinus rhythm at 83 bpm.  Right bundle branch block with repolarization changes.  QTc interval 470 ms.  No ectopy  September 2019 ECG (independently read by me): Normal sinus rhythm at 90 bpm.  Right bundle branch block with repolarization changes.  Borderline first-degree block with a PR interval of 2 8 ms.  QTc interval 481 ms.    May 2017 ECG (independently read by me): Normal sinus rhythm at 89 bpm.  Right bundle-branch block with repolarization changes.  LABS:     Latest Ref Rng & Units 02/16/2019   11:58 AM 04/28/2018    3:32 PM 01/10/2016    6:22 AM  BMP  Glucose 65 - 99 mg/dL 604  540  981   BUN 8 - 27 mg/dL 16  14  22    Creatinine 0.57 - 1.00 mg/dL 1.91  4.78  2.95   BUN/Creat Ratio 12 - 28 18  17     Sodium 134 - 144 mmol/L 137  140  137   Potassium 3.5 - 5.2 mmol/L 5.0  4.3  4.2   Chloride 96 - 106 mmol/L 99  101  102   CO2 20 -  29 mmol/L 25  22  21    Calcium 8.7 - 10.3 mg/dL 9.8  9.8  9.7         Latest Ref Rng & Units 02/16/2019   11:58 AM 06/30/2008   12:28 AM  Hepatic Function  Total Protein 6.0 - 8.5 g/dL 7.2  7.4   Albumin 3.8 - 4.8 g/dL 4.6  4.0   AST 0 - 40 IU/L 21  32   ALT 0 - 32 IU/L 22  33   Alk Phosphatase 39 - 117 IU/L 96  91   Total Bilirubin 0.0 - 1.2 mg/dL 0.4  0.7        Latest Ref Rng & Units 05/16/2020    2:49 PM 01/10/2016    6:22 AM 07/01/2008    6:00 AM  CBC  WBC 4.0 - 10.5 K/uL 12.0  11.3  8.9   Hemoglobin 12.0 - 15.0 g/dL 16.1  09.6  04.5   Hematocrit 36.0 - 46.0 % 36.9  35.4  38.1   Platelets 150.0 - 400.0 K/uL 298.0  267  247    Lab Results  Component Value Date   MCV 88.5 05/16/2020   MCV 89.4 01/10/2016   MCV 90.0 07/01/2008   Lab Results  Component Value Date   TSH 0.937 01/10/2016   Lab Results  Component Value Date   HGBA1C  07/01/2008     5.7 (NOTE)   The ADA recommends the following therapeutic goal for glycemic   control related to Hgb A1C measurement:   Goal of Therapy:   < 7.0% Hgb A1C   Reference: American Diabetes Association: Clinical Practice   Recommendations 2008, Diabetes Care,  2008, 31:(Suppl 1).     BNP No results found for: "BNP"  ProBNP    Component Value Date/Time   PROBNP <30.0 07/01/2008 0612     Lipid Panel     Component Value Date/Time   CHOL  07/01/2008 0612    128        ATP III CLASSIFICATION:  <200     mg/dL   Desirable  409-811  mg/dL   Borderline High  >=914    mg/dL   High   TRIG 93 78/29/5621 0612   HDL 25 (L) 07/01/2008 0612   CHOLHDL 5.1 07/01/2008 0612   VLDL 19 07/01/2008 0612   LDLCALC  07/01/2008 0612    84        Total Cholesterol/HDL:CHD Risk Coronary Heart Disease Risk Table                     Men   Women  1/2 Average Risk   3.4   3.3    Recent laboratory at [facility] September 2019: Total cholesterol 128, triglycerides 151, LDL 48, HDL 34.  BUN 15 creatinine 0.77.  TSH 1.78.  RADIOLOGY: XR HIP UNILAT W OR W/O PELVIS 2-3 VIEWS LEFT Result Date: 10/01/2023 AP pelvis lateral view left hip: No acute fractures.  Both hips are well located.  Osteophytes consistent with pincer type impingement bilateral hips.  Otherwise joint spaces well-maintained.  No bony abnormalities otherwise.    CT KNEE RIGHT WO CONTRAST Result Date: 10/01/2023 CLINICAL DATA:  Right knee pain since falling a few weeks ago. EXAM: CT OF THE RIGHT KNEE WITHOUT CONTRAST TECHNIQUE: Multidetector CT imaging of the right knee was performed according to the standard protocol. Multiplanar CT image reconstructions were also generated. RADIATION DOSE REDUCTION: This exam was performed according to the departmental dose-optimization program  which includes automated exposure control, adjustment of the mA and/or kV according to patient size and/or use of iterative reconstruction technique. COMPARISON:   Radiographs 08/12/2023 and 11/10/2022. FINDINGS: Bones/Joint/Cartilage No evidence of acute fracture, dislocation or osteonecrosis. There are moderate to advanced tricompartmental degenerative changes with joint space narrowing and osteophytes. There is a small knee joint effusion with small intra-articular loose bodies. No evidence of lipohemarthrosis. Ligaments Suboptimally assessed by CT. Grossly intact cruciate and collateral ligaments. Muscles and Tendons The quadriceps and patellar tendons are intact. There is mild spurring at the quadriceps insertion on the patella. No focal muscular atrophy or intramuscular fluid collection. Soft tissues Nonspecific moderate prepatellar subcutaneous edema. No evidence of focal fluid collection, foreign body or soft tissue emphysema. IMPRESSION: 1. No evidence of acute fracture, dislocation or osteonecrosis. 2. Moderate to advanced tricompartmental degenerative changes with small knee joint effusion and small intra-articular loose bodies. 3. Nonspecific prepatellar subcutaneous edema which could reflect posttraumatic cellulitis or bursitis. No evidence of focal fluid collection, foreign body or soft tissue emphysema. Electronically Signed   By: Carey Bullocks M.D.   On: 10/01/2023 13:50    IMPRESSION: 1. Essential hypertension   2. Palpitation   3. Chronic diastolic heart failure (HCC)   4. Mixed hyperlipidemia   5. Right bundle branch block   6. Preop cardiovascular exam   7. Hypothyroidism, unspecified type      ASSESSMENT AND PLAN: Ms. Ladaysha Soutar is a 78 year old female who has a history of hypertension, hypothyroidism, mitral valve prolapse, and palpitations.  She had been on metoprolol 100 mg twice a day, Maxide every other day, and remotely had been started on Lanoxin 0.125 mg, which she had been taken for many years.  An echo Doppler study on September 23, 2018  showed an EF of 55 to 60% ,grade 2 diastolic dysfunction and also elevated filling pressures.  There was mild left atrial dilatation.  When I last saw her in 2020 she was under significantly increased stress  with the death of her husband this past weekend and the death of her brother who was ill in a nursing home today.  She is no longer taking digoxin.  Her blood pressure today is elevated despite taking metoprolol 100 mg twice a day, ramipril 10 mg twice a day and Maxide every other day.  With her blood pressure elevation and previous intolerance to Cardizem I attempted a trial of amlodipine and she will initiate 5 mg at bedtime.  Over the last 5 years, she has remained fairly stable.  At time she had noted some anxiety mediated palpitations.  Presently, she is on metoprolol tartrate 100 mg twice a day in addition to ramipril 5 mg daily and diameter and HCT 37.5/25 mg daily for blood pressure control.  Blood pressure today is mildly increased.  She has not had any recent palpitations which have been controlled with metoprolol.  He is in need to undergo possible knee surgery by Dr. Magnus Ivan.  I have recommended that she undergo a echo Doppler study for reassessment of LV function.  She is followed by Lauretta Chester, FNP who follows her labs.  Laboratory from September 08, 2023 showed an LDL of 58 with total cholesterol 109 and triglycerides 183 on her current regimen of rosuvastatin 20 mg daily and omega-3 fatty acid..  I will contact her regarding her echocardiographic assessment.  She is stable for knee surgery unless otherwise indicated.  I have suggested that she have a follow-up visit with Bernadene Person, NP  in 1 year.   Lennette Bihari, MD, [facility] 10/15/2023 4:02 PM

## 2023-10-15 ENCOUNTER — Encounter: Payer: Self-pay | Admitting: Cardiovascular Disease

## 2023-10-23 ENCOUNTER — Other Ambulatory Visit: Payer: Medicare Other

## 2023-10-27 ENCOUNTER — Ambulatory Visit
Admission: RE | Admit: 2023-10-27 | Discharge: 2023-10-27 | Disposition: A | Discharge status: Home / Self Care | Payer: Medicare Other | Source: Ambulatory Visit | Attending: Student | Admitting: Student

## 2023-10-27 DIAGNOSIS — G8929 Other chronic pain: Secondary | ICD-10-CM | POA: Diagnosis not present

## 2023-10-27 DIAGNOSIS — M5416 Radiculopathy, lumbar region: Secondary | ICD-10-CM | POA: Diagnosis not present

## 2023-11-03 ENCOUNTER — Encounter: Payer: Self-pay | Admitting: Physician Assistant

## 2023-11-03 ENCOUNTER — Ambulatory Visit: Payer: Medicare Other | Admitting: Physician Assistant

## 2023-11-03 DIAGNOSIS — M1712 Unilateral primary osteoarthritis, left knee: Secondary | ICD-10-CM | POA: Diagnosis not present

## 2023-11-03 MED ORDER — METHYLPREDNISOLONE ACETATE 40 MG/ML IJ SUSP
40.0000 mg | INTRAMUSCULAR | Status: AC | PRN
Start: 2023-11-03 — End: 2023-11-03
  Administered 2023-11-03: 40 mg via INTRA_ARTICULAR

## 2023-11-03 MED ORDER — BUPIVACAINE HCL 0.25 % IJ SOLN
2.0000 mL | INTRAMUSCULAR | Status: AC | PRN
Start: 2023-11-03 — End: 2023-11-03
  Administered 2023-11-03: 2 mL via INTRA_ARTICULAR

## 2023-11-03 NOTE — Progress Notes (Signed)
 Office Visit Note   [name]           Date of Birth: 04-Nov-1945           MRN: [id number] Visit Date: 11/03/2023              Requested by: Fatima Sanger, FNP [address] [place],  [place] [address] PCP: Fatima Sanger, FNP  Chief Complaint  Patient presents with   Left Knee - Edema      HPI: Avree is a pleasant 78 year old woman with bilateral arthritis in her knees.  She periodically gets aspirations and injections.  She comes in complaining of fluid in her left knee today no injuries no fever chills  Assessment & Plan: Visit Diagnoses: Osteoarthritis left knee  Plan: Micah Flesher forward with an aspiration of 40 cc of blood-tinged serous few fluid today was able to inject her with some steroid.  May follow-up as needed  Follow-Up Instructions: No follow-ups on file.   Ortho Exam  Patient is alert, oriented, no adenopathy, well-dressed, normal affect, normal respiratory effort. Examination of her left knee she has a moderate effusion but no warmth compartments are soft and compressible she is neurovascularly intact no evidence of infection  Imaging: No results found. No images are attached to the encounter.  Labs: Lab Results  Component Value Date   HGBA1C  07/01/2008    5.7 (NOTE)   The ADA recommends the following therapeutic goal for glycemic   control related to Hgb A1C measurement:   Goal of Therapy:   < 7.0% Hgb A1C   Reference: American Diabetes Association: Clinical Practice   Recommendations 2008, Diabetes Care,  2008, 31:(Suppl 1).     Lab Results  Component Value Date   ALBUMIN 4.6 02/16/2019   ALBUMIN 4.0 06/30/2008    Lab Results  Component Value Date   MG 1.8 04/28/2018   MG 2.1 07/01/2008   MG 2.1 06/30/2008   No results found for: "VD25OH"  No results found for: "PREALBUMIN"    Latest Ref Rng & Units 05/16/2020    2:49 PM 01/10/2016    6:22 AM 07/01/2008    6:00 AM  CBC EXTENDED  WBC 4.0 - 10.5  K/uL 12.0  11.3  8.9   RBC 3.87 - 5.11 Mil/uL 4.17  3.96  4.23   Hemoglobin 12.0 - 15.0 g/dL 19.1  47.8  29.5   HCT 36.0 - 46.0 % 36.9  35.4  38.1   Platelets 150.0 - 400.0 K/uL 298.0  267  247   NEUT# 1.4 - 7.7 K/uL 7.8  8.8    Lymph# 0.7 - 4.0 K/uL 3.0  1.9       There is no height or weight on file to calculate BMI.  Orders:  No orders of the defined types were placed in this encounter.  No orders of the defined types were placed in this encounter.    Procedures: Large Joint Inj on 11/03/2023 9:49 AM Indications: pain and diagnostic evaluation Details: 1.5 in superolateral approach  Arthrogram: No  Medications: 40 mg methylPREDNISolone acetate 40 MG/ML; 2 mL bupivacaine 0.25 % Outcome: tolerated well, no immediate complications  After verbal consent was obtained she was prepped with alcohol and Betadine over the superior lateral pouch of the left knee.  3 cc of lidocaine plain was then injected.  After adequate analgesia 40 cc of blood-tinged serous fluid was aspirated with an 18-gauge needle after reprepping.  This was followed by  40 cc of methylprednisolone injected Band-Aid and Ace wrap were applied patient ambulated from the clinic Procedure, treatment alternatives, risks and benefits explained, specific risks discussed. Consent was given by the patient.      Clinical Data: No additional findings.  ROS:  All other systems negative, except as noted in the HPI. Review of Systems  Objective: Vital Signs: There were no vitals taken for this visit.  Specialty Comments:  No specialty comments available.  PMFS History: Patient Active Problem List   Diagnosis Date Noted   Unilateral primary osteoarthritis, right knee 01/06/2018   Palpitations    Essential hypertension 01/27/2016   Hypothyroidism 01/27/2016   Right bundle branch block 01/27/2016   Hyperlipidemia LDL goal <70 01/27/2016   Bilateral lower extremity edema 01/27/2016   Past Medical History:   Diagnosis Date   Allergy    seasonal   Anxiety    Arthritis    Cataract    removal bil   Essential hypertension    Heart murmur    a. 01/2016 Echo: EF 65-70%, no rwma, Gr1 DD, mild MR/TR, mild to mod PR, PASP .   History of chronic back pain    Hyperlipidemia    Hypothyroidism    Osteoarthritis    Palpitations     Family History  Problem Relation Age of Onset   Colon polyps Sister    Colon cancer Sister 58   Esophageal cancer Neg Hx    Pancreatic cancer Neg Hx    Prostate cancer Neg Hx    Rectal cancer Neg Hx    Stomach cancer Neg Hx     Past Surgical History:  Procedure Laterality Date   ABDOMINAL HYSTERECTOMY     ANTERIOR FUSION CERVICAL SPINE     3 cervical fusions   APPENDECTOMY     BREAST EXCISIONAL BIOPSY Right    benign   CHOLECYSTECTOMY     COLONOSCOPY     TONSILLECTOMY AND ADENOIDECTOMY     Social History   Occupational History   Not on file  Tobacco Use   Smoking status: Never   Smokeless tobacco: Never  Vaping Use   Vaping status: Never Used  Substance and Sexual Activity   Alcohol use: No   Drug use: No   Sexual activity: Not on file

## 2023-11-04 ENCOUNTER — Ambulatory Visit (HOSPITAL_COMMUNITY): Payer: Medicare Other | Attending: Cardiovascular Disease

## 2023-11-04 DIAGNOSIS — Z01810 Encounter for preprocedural cardiovascular examination: Secondary | ICD-10-CM | POA: Insufficient documentation

## 2023-11-04 LAB — ECHOCARDIOGRAM COMPLETE
Area-P 1/2: 4.33 cm2
S' Lateral: 2.7 cm

## 2023-11-05 DIAGNOSIS — M5126 Other intervertebral disc displacement, lumbar region: Secondary | ICD-10-CM | POA: Diagnosis not present

## 2023-11-05 DIAGNOSIS — M5416 Radiculopathy, lumbar region: Secondary | ICD-10-CM | POA: Diagnosis not present

## 2023-11-06 ENCOUNTER — Telehealth: Payer: Self-pay | Admitting: *Deleted

## 2023-11-06 NOTE — Telephone Encounter (Signed)
   Patient Name: Misty Newman  DOB: 1946/01/23 MRN: [id number]  Primary Cardiologist: Nicki Guadalajara, MD  Chart reviewed as part of pre-operative protocol coverage. Pt was recently seen in clinic on 10/22/2023 by Dr. Tresa Endo and was doing well. She was cleared for surgery at the time. Additionally,recent echocardiogram was normal. Therefore, given past medical history and time since last visit, based on ACC/AHA guidelines, [name] is at acceptable risk for the planned procedure without further cardiovascular testing.    I will route this recommendation to the requesting party via Epic fax function and remove from pre-op pool.  Please call with questions.  Joylene Grapes, NP 11/06/2023, 10:18 AM

## 2023-11-06 NOTE — Telephone Encounter (Signed)
   Pre-operative Risk Assessment    Patient Name: Misty Newman  DOB: Nov 12, 1945 MRN: [id number]   Date of last office visit: 10/12/23 DR. KELLY Date of next office visit: NONE   Request for Surgical Clearance    Procedure:   L3-4 LUMBAR MICRODISKECTOMY  Date of Surgery:  Clearance TBD                                Surgeon: DR. Julio Sicks Surgeon's Group or Practice Name:  [facility] Phone number:  [phone] Fax number:  [phone] ATTN: [place] 8244   Type of Clearance Requested:   - Medical ; NONE LISTED AS TO BE HELD   Type of Anesthesia:  General    Additional requests/questions:    Elpidio Anis   11/06/2023, 10:03 AM

## 2023-11-30 DIAGNOSIS — J45909 Unspecified asthma, uncomplicated: Secondary | ICD-10-CM | POA: Diagnosis not present

## 2023-12-28 ENCOUNTER — Ambulatory Visit: Admitting: Physician Assistant

## 2023-12-30 DIAGNOSIS — Z981 Arthrodesis status: Secondary | ICD-10-CM | POA: Diagnosis not present

## 2023-12-30 DIAGNOSIS — J45909 Unspecified asthma, uncomplicated: Secondary | ICD-10-CM | POA: Diagnosis not present

## 2023-12-30 DIAGNOSIS — U071 COVID-19: Secondary | ICD-10-CM | POA: Diagnosis not present

## 2023-12-30 DIAGNOSIS — J209 Acute bronchitis, unspecified: Secondary | ICD-10-CM | POA: Diagnosis not present

## 2023-12-30 DIAGNOSIS — R051 Acute cough: Secondary | ICD-10-CM | POA: Diagnosis not present

## 2023-12-30 DIAGNOSIS — R059 Cough, unspecified: Secondary | ICD-10-CM | POA: Diagnosis not present

## 2024-01-11 ENCOUNTER — Ambulatory Visit: Admitting: Physician Assistant

## 2024-01-11 ENCOUNTER — Encounter: Payer: Self-pay | Admitting: Physician Assistant

## 2024-01-11 DIAGNOSIS — M170 Bilateral primary osteoarthritis of knee: Secondary | ICD-10-CM | POA: Diagnosis not present

## 2024-01-11 DIAGNOSIS — M1712 Unilateral primary osteoarthritis, left knee: Secondary | ICD-10-CM

## 2024-01-11 DIAGNOSIS — M1711 Unilateral primary osteoarthritis, right knee: Secondary | ICD-10-CM

## 2024-01-11 MED ORDER — LIDOCAINE HCL 1 % IJ SOLN
3.0000 mL | INTRAMUSCULAR | Status: AC | PRN
Start: 2024-01-11 — End: 2024-01-11
  Administered 2024-01-11: 3 mL

## 2024-01-11 MED ORDER — LIDOCAINE HCL 1 % IJ SOLN
5.0000 mL | INTRAMUSCULAR | Status: AC | PRN
Start: 2024-01-11 — End: 2024-01-11
  Administered 2024-01-11: 5 mL

## 2024-01-11 MED ORDER — METHYLPREDNISOLONE ACETATE 40 MG/ML IJ SUSP
40.0000 mg | INTRAMUSCULAR | Status: AC | PRN
Start: 2024-01-11 — End: 2024-01-11
  Administered 2024-01-11: 40 mg via INTRA_ARTICULAR

## 2024-01-11 NOTE — Progress Notes (Signed)
   Procedure Note  [name]             Date of Birth: 05/13/1946           MRN: [id number]             Visit Date: 01/11/2024 HPI: Misty Newman returns today requesting aspiration of both knees.  She last had an injection aspiration of the left knee on 11/03/2023.  She has had no new injuries to either knee.  Right knee aspiration injection last performed on 10/01/2023.  She denies any fevers chills.  Physical exam: General Well-developed well-nourished pleasant female in no acute distress.  Affect appropriate Bilateral knees: Good range of motion both knees no abnormal warmth erythema.  Effusion both knees.  Procedures: Visit Diagnoses:  1. Unilateral primary osteoarthritis, left knee   2. Unilateral primary osteoarthritis, right knee     Large Joint Inj: bilateral knee on 01/11/2024 11:57 AM Indications: pain Details: 22 G 1.5 in needle, anterolateral approach  Arthrogram: No  Medications (Right): 5 mL lidocaine  1 %; 40 mg methylPREDNISolone  acetate 40 MG/ML Aspirate (Right): 15 mL yellow Medications (Left): 3 mL lidocaine  1 % Aspirate (Left): 30 mL yellow Outcome: tolerated well, no immediate complications Procedure, treatment alternatives, risks and benefits explained, specific risks discussed. Consent was given by the patient. Immediately prior to procedure a time out was called to verify the correct patient, procedure, equipment, support staff and site/side marked as required. Patient was prepped and draped in the usual sterile fashion.    Plan: She knows to wait least 3 months between cortisone injections.  Questions were encouraged and answered at length.  Follow-up as needed

## 2024-02-10 DIAGNOSIS — L508 Other urticaria: Secondary | ICD-10-CM | POA: Diagnosis not present

## 2024-03-03 DIAGNOSIS — R739 Hyperglycemia, unspecified: Secondary | ICD-10-CM | POA: Diagnosis not present

## 2024-03-03 DIAGNOSIS — E119 Type 2 diabetes mellitus without complications: Secondary | ICD-10-CM | POA: Diagnosis not present

## 2024-03-03 DIAGNOSIS — E039 Hypothyroidism, unspecified: Secondary | ICD-10-CM | POA: Diagnosis not present

## 2024-03-03 DIAGNOSIS — I10 Essential (primary) hypertension: Secondary | ICD-10-CM | POA: Diagnosis not present

## 2024-03-15 LAB — LAB REPORT - SCANNED: EGFR: 64

## 2024-03-17 DIAGNOSIS — E1151 Type 2 diabetes mellitus with diabetic peripheral angiopathy without gangrene: Secondary | ICD-10-CM | POA: Diagnosis not present

## 2024-03-17 DIAGNOSIS — Z0000 Encounter for general adult medical examination without abnormal findings: Secondary | ICD-10-CM | POA: Diagnosis not present

## 2024-03-17 DIAGNOSIS — I5032 Chronic diastolic (congestive) heart failure: Secondary | ICD-10-CM | POA: Diagnosis not present

## 2024-03-17 DIAGNOSIS — E789 Disorder of lipoprotein metabolism, unspecified: Secondary | ICD-10-CM | POA: Diagnosis not present

## 2024-03-17 DIAGNOSIS — R600 Localized edema: Secondary | ICD-10-CM | POA: Diagnosis not present

## 2024-03-17 DIAGNOSIS — E039 Hypothyroidism, unspecified: Secondary | ICD-10-CM | POA: Diagnosis not present

## 2024-03-17 DIAGNOSIS — I10 Essential (primary) hypertension: Secondary | ICD-10-CM | POA: Diagnosis not present

## 2024-03-24 DIAGNOSIS — R600 Localized edema: Secondary | ICD-10-CM | POA: Diagnosis not present

## 2024-03-24 DIAGNOSIS — I10 Essential (primary) hypertension: Secondary | ICD-10-CM | POA: Diagnosis not present

## 2024-03-31 ENCOUNTER — Ambulatory Visit: Admitting: Physician Assistant

## 2024-04-06 ENCOUNTER — Other Ambulatory Visit: Payer: Self-pay | Admitting: Registered Nurse

## 2024-04-06 DIAGNOSIS — Z1231 Encounter for screening mammogram for malignant neoplasm of breast: Secondary | ICD-10-CM

## 2024-04-11 ENCOUNTER — Ambulatory Visit: Admitting: Physician Assistant

## 2024-04-11 DIAGNOSIS — M1712 Unilateral primary osteoarthritis, left knee: Secondary | ICD-10-CM

## 2024-04-11 DIAGNOSIS — M170 Bilateral primary osteoarthritis of knee: Secondary | ICD-10-CM

## 2024-04-11 DIAGNOSIS — M1711 Unilateral primary osteoarthritis, right knee: Secondary | ICD-10-CM

## 2024-04-11 MED ORDER — LIDOCAINE HCL 1 % IJ SOLN
5.0000 mL | INTRAMUSCULAR | Status: AC | PRN
Start: 2024-04-11 — End: 2024-04-11
  Administered 2024-04-11 (×2): 5 mL

## 2024-04-11 MED ORDER — METHYLPREDNISOLONE ACETATE 40 MG/ML IJ SUSP
40.0000 mg | INTRAMUSCULAR | Status: AC | PRN
Start: 2024-04-11 — End: 2024-04-11
  Administered 2024-04-11 (×2): 40 mg via INTRA_ARTICULAR

## 2024-04-11 MED ORDER — LIDOCAINE HCL 1 % IJ SOLN
3.0000 mL | INTRAMUSCULAR | Status: AC | PRN
Start: 2024-04-11 — End: 2024-04-11
  Administered 2024-04-11 (×2): 3 mL

## 2024-04-11 NOTE — Progress Notes (Signed)
   Procedure Note  [name]             Date of Birth: 1945-12-04           MRN: [id number]             Visit Date: 04/11/2024 HPI: Misty Newman comes in today requesting aspiration of both knees.  She states that both knees are achy and tight.  She was thinking about having knee replacements.  She is asking for just the right knee to be injected today with cortisone.  No new injuries no fever or chills.  Physical exam: Bilateral knees no abnormal warmth or erythema.  Slight effusion both knees.  Overall good range of motion both knees.  Ambulates without any assistive device.  Procedures: Visit Diagnoses:  1. Unilateral primary osteoarthritis, left knee   2. Unilateral primary osteoarthritis, right knee     Large Joint Inj: bilateral knee on 04/11/2024 3:44 PM Indications: pain Details: 22 G 1.5 in needle, superolateral approach  Arthrogram: No  Medications (Right): 5 mL lidocaine  1 %; 40 mg methylPREDNISolone  acetate 40 MG/ML Aspirate (Right): 12 mL yellow Medications (Left): 3 mL lidocaine  1 % Aspirate (Left): 27 mL yellow Outcome: tolerated well, no immediate complications Procedure, treatment alternatives, risks and benefits explained, specific risks discussed. Consent was given by the patient. Immediately prior to procedure a time out was called to verify the correct patient, procedure, equipment, support staff and site/side marked as required. Patient was prepped and draped in the usual sterile fashion.      Plan: She knows to wait at least 3 months between injections with cortisone.  She will follow-up with us  as needed.

## 2024-05-03 ENCOUNTER — Ambulatory Visit
Admission: RE | Admit: 2024-05-03 | Discharge: 2024-05-03 | Disposition: A | Discharge status: Home / Self Care | Source: Ambulatory Visit | Attending: Registered Nurse | Admitting: Registered Nurse

## 2024-05-03 DIAGNOSIS — Z1231 Encounter for screening mammogram for malignant neoplasm of breast: Secondary | ICD-10-CM

## 2024-05-06 ENCOUNTER — Telehealth: Payer: Self-pay | Admitting: Orthopaedic Surgery

## 2024-05-06 NOTE — Telephone Encounter (Signed)
 Patient called and said she just ready for you to set up the surgery. CB#[phone]

## 2024-05-13 ENCOUNTER — Telehealth: Payer: Self-pay

## 2024-05-13 ENCOUNTER — Telehealth (HOSPITAL_BASED_OUTPATIENT_CLINIC_OR_DEPARTMENT_OTHER): Payer: Self-pay | Admitting: *Deleted

## 2024-05-13 NOTE — Telephone Encounter (Signed)
  Patient Consent for Virtual Visit         Misty Newman has provided verbal consent on 05/13/2024 for a virtual visit (video or telephone).  Appointment scheduled for 05/19/2024 @ 10:40. Call patient at [phone]. Med req and consent are complete.    CONSENT FOR VIRTUAL VISIT FOR:  Misty Newman  By participating in this virtual visit I agree to the following:  I hereby voluntarily request, consent and authorize [facility] and its employed or contracted physicians, physician assistants, nurse practitioners or other licensed health care professionals (the Practitioner), to provide me with telemedicine health care services (the "Services) as deemed necessary by the treating Practitioner. I acknowledge and consent to receive the Services by the Practitioner via telemedicine. I understand that the telemedicine visit will involve communicating with the Practitioner through live audiovisual communication technology and the disclosure of certain medical information by electronic transmission. I acknowledge that I have been given the opportunity to request an in-person assessment or other available alternative prior to the telemedicine visit and am voluntarily participating in the telemedicine visit.  I understand that I have the right to withhold or withdraw my consent to the use of telemedicine in the course of my care at any time, without affecting my right to future care or treatment, and that the Practitioner or I may terminate the telemedicine visit at any time. I understand that I have the right to inspect all information obtained and/or recorded in the course of the telemedicine visit and may receive copies of available information for a reasonable fee.  I understand that some of the potential risks of receiving the Services via telemedicine include:  Delay or interruption in medical evaluation due to technological equipment failure or disruption; Information transmitted may  not be sufficient (e.g. poor resolution of images) to allow for appropriate medical decision making by the Practitioner; and/or  In rare instances, security protocols could fail, causing a breach of personal health information.  Furthermore, I acknowledge that it is my responsibility to provide information about my medical history, conditions and care that is complete and accurate to the best of my ability. I acknowledge that Practitioner's advice, recommendations, and/or decision may be based on factors not within their control, such as incomplete or inaccurate data provided by me or distortions of diagnostic images or specimens that may result from electronic transmissions. I understand that the practice of medicine is not an exact science and that Practitioner makes no warranties or guarantees regarding treatment outcomes. I acknowledge that a copy of this consent can be made available to me via my patient portal [facility] MyChart), or I can request a printed copy by calling the office of [facility].    I understand that my insurance will be billed for this visit.   I have read or had this consent read to me. I understand the contents of this consent, which adequately explains the benefits and risks of the Services being provided via telemedicine.  I have been provided ample opportunity to ask questions regarding this consent and the Services and have had my questions answered to my satisfaction. I give my informed consent for the services to be provided through the use of telemedicine in my medical care

## 2024-05-13 NOTE — Telephone Encounter (Signed)
   Pre-operative Risk Assessment    Patient Name: Misty Newman  DOB: Mar 04, 1946 MRN: [id number]   Date of last office visit: 10/12/23 Date of next office visit: Recall for 10/06/24   Request for Surgical Clearance    Procedure:  Knee Replacement  Date of Surgery:  Clearance TBD                                Surgeon:  Dr. Lonni Dutch Surgeon's Group or Practice Name:  [facility] Phone number:  [phone] Fax number:  [phone] Attn: Julianna   Type of Clearance Requested:   - Medical    Type of Anesthesia:  Spinal   Additional requests/questions:  Please advise surgeon/provider what medications should be held.  Bonney Burnard Cha   05/13/2024, 4:30 PM

## 2024-05-13 NOTE — Telephone Encounter (Signed)
 Spoke with patient and she has appt with Tory next week.  She said both knees are bad so needs to decide which one.  She stated she might need cardiac clearance due to heart history.  I advised I would fax clearance to cardiologist.  She can see Tory to determine which knee.  Once clearance received, I will contact her back about scheduling.

## 2024-05-13 NOTE — Telephone Encounter (Signed)
 Appointment scheduled for 05/19/2024 @ 10:40. Call patient at [phone]. Med req and consent are complete.

## 2024-05-13 NOTE — Telephone Encounter (Signed)
   Name: Misty Newman  DOB: 1946/08/22  MRN: [id number]  Primary Cardiologist: Debby Sor, MD (Inactive)   Preoperative team, please contact this patient and set up a phone call appointment for further preoperative risk assessment. Please obtain consent and complete medication review. Thank you for your help.  I confirm that guidance regarding antiplatelet and oral anticoagulation therapy has been completed and, if necessary, noted below.  Patient is not on anticoagulation or antiplatelet per review of medical record in Epic.    I also confirmed the patient resides in the state of [place] . As per [facility] Medical Board telemedicine laws, the patient must reside in the state in which the provider is licensed.   Lamarr Satterfield, NP 05/13/2024, 4:38 PM [facility]

## 2024-05-17 DIAGNOSIS — I5032 Chronic diastolic (congestive) heart failure: Secondary | ICD-10-CM | POA: Diagnosis not present

## 2024-05-17 DIAGNOSIS — R600 Localized edema: Secondary | ICD-10-CM | POA: Diagnosis not present

## 2024-05-17 DIAGNOSIS — I10 Essential (primary) hypertension: Secondary | ICD-10-CM | POA: Diagnosis not present

## 2024-05-19 ENCOUNTER — Encounter: Payer: Self-pay | Admitting: Physician Assistant

## 2024-05-19 ENCOUNTER — Ambulatory Visit: Attending: Cardiology | Admitting: Emergency Medicine

## 2024-05-19 ENCOUNTER — Ambulatory Visit: Admitting: Physician Assistant

## 2024-05-19 VITALS — Ht 66.34 in | Wt 174.8 lb

## 2024-05-19 DIAGNOSIS — M1712 Unilateral primary osteoarthritis, left knee: Secondary | ICD-10-CM

## 2024-05-19 DIAGNOSIS — R002 Palpitations: Secondary | ICD-10-CM | POA: Diagnosis not present

## 2024-05-19 MED ORDER — METHYLPREDNISOLONE ACETATE 40 MG/ML IJ SUSP
40.0000 mg | INTRAMUSCULAR | Status: AC | PRN
  Administered 2024-05-19: 40 mg via INTRA_ARTICULAR

## 2024-05-19 MED ORDER — LIDOCAINE HCL 1 % IJ SOLN
4.0000 mL | INTRAMUSCULAR | Status: AC | PRN
  Administered 2024-05-19: 4 mL

## 2024-05-19 NOTE — Progress Notes (Signed)
 HPI: Misty Newman comes in today to discuss possible surgery bilateral knees.  However unfortunately she has had bilateral leg swelling and is now on Lasix.  She denies any chest pain, shortness of breath, or orthopnea.  States that her leg swelling both legs goes down with rest at night.  She did try compression hose but states she is unable to tolerate these.  She is stating that she needs to see her cardiologist for clearance for surgery per her primary care provider.  She is diabetic hemoglobin A1c last was 6.8.  Denies fevers or chills.  Review of systems: See HPI otherwise negative  Physical exam: Height 168.5 cm weight 174.8 pounds General Well-developed well-nourished female who ambulates without any assistive device.  Bilateral knees: Left knee no abnormal warmth erythema.  Positive effusion.  Right knee no effusion.  Good range of motion both knees.  No instability of either knee.  Impression: Left knee osteoarthritis Right knee osteoarthritis  Plan: Discussed with her that she definitely would need cardiac clearance.  Discussed surgical procedure of knee replacement with her at length.  Discussed postoperative protocol.  Risk benefits of surgery discussed.  Risk include but are not limited to DVT/PE, wound healing problems, nerve vessel injury, blood loss, prolonged pain and worsening pain.  She is having significant pain in her left knee today and wanted the fluid aspirated since we had only aspirated fluid last time we did provide her with a cortisone injection she is to watch her glucose levels closely over the next 24 to 48 hours.  Follow-up with us  once she has cardiac clearance.    Procedure Note  [name]             Date of Birth: 1945-11-28           MRN: [id number]             Visit Date: 05/19/2024  Procedures: Visit Diagnoses:  1. Unilateral primary osteoarthritis, left knee     Large Joint Inj: L knee on 05/19/2024 4:45 PM Indications: pain Details:  22 G 1.5 in needle, anterolateral approach  Arthrogram: No  Medications: 4 mL lidocaine  1 %; 40 mg methylPREDNISolone  acetate 40 MG/ML Aspirate: 24 mL yellow Outcome: tolerated well, no immediate complications Procedure, treatment alternatives, risks and benefits explained, specific risks discussed. Consent was given by the patient. Immediately prior to procedure a time out was called to verify the correct patient, procedure, equipment, support staff and site/side marked as required. Patient was prepped and draped in the usual sterile fashion.

## 2024-05-19 NOTE — Progress Notes (Signed)
 Virtual Visit via Telephone Note   Because of Misty Newman co-morbid illnesses, she is at least at moderate risk for complications without adequate follow up.  This format is felt to be most appropriate for this patient at this time.  Due to technical limitations with video connection Web designer), today's appointment will be conducted as an audio only telehealth visit, and Misty Newman verbally agreed to proceed in this manner.   All issues noted in this document were discussed and addressed.  No physical exam could be performed with this format.  Evaluation Performed:  Preoperative cardiovascular risk assessment _____________   Date:  05/19/2024   Patient ID:  Misty Newman, DOB 11-Mar-1946, MRN [id number] Patient Location:  Home Provider location:   Office  Primary Care Provider:  Royden Ronal Czar, FNP Primary Cardiologist:  Debby Sor, MD (Inactive)  Chief Complaint / Patient Profile   78 y.o. y/o female with a h/o mitral valve prolapse, palpitations, hypertension, hyperlipidemia, thyroid  disease who is pending knee replacement by Dr. Lonni Poli, with Ortho care at [facility], date TBD, and presents today for telephonic preoperative cardiovascular risk assessment.  History of Present Illness    Misty Newman is a 78 y.o. female who presents via audio/video conferencing for a telehealth visit today.  Pt was last seen in cardiology clinic on 10/12/2023 by Dr. Debby Sor.  At that time Misty Newman was doing well.  The patient is now pending procedure as outlined above.   She did report new significant leg swelling occurring over the last couple weeks. Visited her PCP two days ago, states she was told to go up on her daily dose of lasix (not on med list, prescribed by PCP who's note I do not have access to), but does not feel this has resolved her edema. Also reports some dizziness/lightheadedness and palpitations at times, unsure if this is  worsened/changed from previous visits. Reports some concerns over intermittent chest pain/indigestion occurring after eating yesterday, resolved with tums. Does report occasional SOB/DOE she believes is secondary to her asthma, mostly resolved with inhaler use. States she is able to walk up stairs when her knees aren't bothering her, and that a couple weeks ago she got outside and exerted herself more than usual after raking leaves.  Since her last visit, she denies pnd, orthopnea, n, v, syncope, weight gain, or early satiety. All other systems reviewed and are otherwise negative except as noted above.    Past Medical History    Past Medical History:  Diagnosis Date   Allergy    seasonal   Anxiety    Arthritis    Cataract    removal bil   Essential hypertension    Heart murmur    a. 01/2016 Echo: EF 65-70%, no rwma, Gr1 DD, mild MR/TR, mild to mod PR, PASP .   History of chronic back pain    Hyperlipidemia    Hypothyroidism    Osteoarthritis    Palpitations    Past Surgical History:  Procedure Laterality Date   ABDOMINAL HYSTERECTOMY     ANTERIOR FUSION CERVICAL SPINE     3 cervical fusions   APPENDECTOMY     BREAST EXCISIONAL BIOPSY Right    benign   CHOLECYSTECTOMY     COLONOSCOPY     TONSILLECTOMY AND ADENOIDECTOMY      Allergies  Allergies  Allergen Reactions   Butorphanol Other (See Comments)   Butorphanol Tartrate     REACTION: sweating, increased heartrate  Cardizem  [Diltiazem  Hcl] Other (See Comments)    Per pt cause b/p to drop low   Clarithromycin     Can't remember   Meperidine Hcl     REACTION: itching     Naloxone     Pt questions allergies, is unaware of ever having, does not sound familiar   Other Other (See Comments)   Penicillins     REACTION: unspecified- as a child   Sulfa Antibiotics    Sulfonamide Derivatives     REACTION: does not remember    Home Medications    Prior to Admission medications   Medication Sig Start Date End  Date Taking? Authorizing Provider  budesonide  (ENTOCORT EC ) 3 MG 24 hr capsule TAKE 3 CAPSULES (9 MG TOTAL) BY MOUTH ONCE DAILY. 04/05/21   Legrand Victory LITTIE DOUGLAS, MD  cholecalciferol (VITAMIN D3) 25 MCG (1000 UT) tablet Take 1,000 Units by mouth daily.    [provider]  clotrimazole -betamethasone  (LOTRISONE ) cream Apply 1 Application topically daily. 01/30/23   Tobie Franky SQUIBB, DPM  cyclobenzaprine (FLEXERIL) 10 MG tablet Take 10 mg by mouth daily.    [provider]  diclofenac (VOLTAREN) 75 MG EC tablet Take 75 mg by mouth daily.    [provider]  dicyclomine  (BENTYL ) 10 MG capsule TAKE 1 CAPSULE (10 MG TOTAL) BY MOUTH EVERY 8 (EIGHT) HOURS AS NEEDED (CRAMPS AND DIARRHEA). 03/04/21   Legrand Victory LITTIE DOUGLAS, MD  diphenoxylate -atropine  (LOMOTIL ) 2.5-0.025 MG tablet Take 1 tablet by mouth 4 (four) times daily as needed for diarrhea or loose stools. 02/08/21   Legrand Victory LITTIE DOUGLAS, MD  fish oil-omega-3 fatty acids 1000 MG capsule Take 1 g by mouth daily.    [provider]  gabapentin  (NEURONTIN ) 300 MG capsule Take 1 capsule (300 mg total) by mouth at bedtime. 06/02/22   Gretta Bertrum ORN, PA-C  Lancets (ONETOUCH DELICA PLUS LANCET33G) MISC TEST ONCE A DAY AS DIRECTED 10/27/19   [provider]  lansoprazole (PREVACID) 30 MG capsule Take 30 mg by mouth daily at 12 noon.    [provider]  levothyroxine (SYNTHROID, LEVOTHROID) 100 MCG tablet Take 88 mcg by mouth daily.    [provider]  metoprolol  tartrate (LOPRESSOR ) 100 MG tablet Take 1 tablet (100 mg total) by mouth 2 (two) times daily. 02/15/19   Acharya, Gayatri A, MD  Misc Natural Products (OSTEO BI-FLEX/5-LOXIN ADVANCED PO) Take by mouth 2 (two) times daily.    [provider]  [facility] VERIO test strip USE TO TEST SUGAR 3 TIMES DAILY 09/24/19   [provider]  ramipril  (ALTACE ) 5 MG capsule Take 5 mg by mouth daily. 03/05/21   [provider]  rosuvastatin (CRESTOR) 20  MG tablet Take 10 mg by mouth daily.     [provider]  triamterene-hydrochlorothiazide (MAXZIDE-25) 37.5-25 MG tablet Take 1 tablet by mouth every other day.    [provider]    Physical Exam    Vital Signs:  Misty Tharon Gretta does not have vital signs available for review today.  Given telephonic nature of communication, physical exam is limited. AAOx3. NAD. Normal affect.  Speech and respirations are unlabored.  Accessory Clinical Findings    None  Assessment & Plan    1.  Preoperative Cardiovascular Risk Assessment:  According to the Revised Cardiac Risk Index (RCRI), her Perioperative Risk of Major Cardiac Event is (%): 0.4  Her Functional Capacity in METs is: 5.29 according to the Duke Activity Status Index (  DASI).  Reports new concerns over increased lower extremity edema not fully resolved with lasix, chest pain questionably secondary to indigestion, lightheadedness. Echo on 11/04/2023 showed LVEF 60-65%, no RWMA, G1DD, RVSP 31 mmHg, trivial mitral regurg. Discussed these concerns with patient and through shared decision making patient states she would feel more comfortable with proceeding if she were evaluated in office going forward prior to proceeding with surgery. Will arrange for staff to have an office visit scheduled for patient moving forward.   A copy of this note will be routed to requesting surgeon.  Time:   Today, I have spent 10 minutes with the patient with telehealth technology discussing medical history, symptoms, and management plan.     Bailie Christenbury E Toinette Lackie, NP  05/19/2024, 9:52 AM

## 2024-05-24 DIAGNOSIS — I5032 Chronic diastolic (congestive) heart failure: Secondary | ICD-10-CM | POA: Diagnosis not present

## 2024-05-24 DIAGNOSIS — R600 Localized edema: Secondary | ICD-10-CM | POA: Diagnosis not present

## 2024-05-24 DIAGNOSIS — I10 Essential (primary) hypertension: Secondary | ICD-10-CM | POA: Diagnosis not present

## 2024-05-25 ENCOUNTER — Telehealth: Payer: Self-pay | Admitting: Physician Assistant

## 2024-05-25 NOTE — Telephone Encounter (Signed)
 I s/w the pt and about scheduling an appt in office due to her swelling is not better yet. Pt tells me that she went to her PCP who adjusted her medications and the swelling is better. Pt also tells me that she is putting her surgery on hold for now. I asked her to have the surgeon office send a new request when she is ready to have her surgery. Pt verbalized understanding.

## 2024-05-25 NOTE — Telephone Encounter (Signed)
 Noted

## 2024-05-25 NOTE — Progress Notes (Signed)
 Me    05/25/24  8:52 AM Note I s/w the pt and about scheduling an appt in office due to her swelling is not better yet. Pt tells me that she went to her PCP who adjusted her medications and the swelling is better. Pt also tells me that she is putting her surgery on hold for now. I asked her to have the surgeon office send a new request when she is ready to have her surgery. Pt verbalized understanding.

## 2024-05-25 NOTE — Telephone Encounter (Signed)
 Pt called and stated she wants to wait to have surgery and was told to call and let Barefoot know. Pt will call when ready for surgery

## 2024-05-26 ENCOUNTER — Telehealth (HOSPITAL_BASED_OUTPATIENT_CLINIC_OR_DEPARTMENT_OTHER): Payer: Self-pay | Admitting: *Deleted

## 2024-05-26 NOTE — Telephone Encounter (Signed)
 I s/w the Misty Newman and informed her that [facility] felt she should still come in and see cardiology for further evaluation. Misty Newman tells me again her swelling in better since PCP adjusted medications, though is agreeable to come in 06/01/24 and see Jackee Alberts, NP @ 1:55.

## 2024-05-26 NOTE — Telephone Encounter (Signed)
-----   Message from Kenzie E Campbell sent at 05/26/2024  8:40 AM EDT ----- Walterine Ivanoff, considering her symptoms, I believe it would be best if she could make an appointment to be evaluated by us . Could you call and have her scheduled an appointment with her Cardiology team? ----- Message ----- From: Moreen Piggott M, CMA Sent: 05/25/2024   8:53 AM EDT To: Kenzie E Campbell, NP

## 2024-06-01 ENCOUNTER — Ambulatory Visit: Admitting: Nurse Practitioner

## 2024-06-20 ENCOUNTER — Encounter: Payer: Self-pay | Admitting: *Deleted

## 2024-06-20 NOTE — Progress Notes (Signed)
 Misty Newman                                          MRN: [id number]   06/20/2024   The VBCI Quality Team Specialist reviewed this patient medical record for the purposes of chart review for care gap closure. The following were reviewed: chart review for care gap closure-kidney health evaluation for diabetes:eGFR  and uACR.    VBCI Quality Team

## 2024-06-21 DIAGNOSIS — L648 Other androgenic alopecia: Secondary | ICD-10-CM | POA: Diagnosis not present

## 2024-06-21 DIAGNOSIS — L650 Telogen effluvium: Secondary | ICD-10-CM | POA: Diagnosis not present

## 2024-06-21 NOTE — Progress Notes (Signed)
 Cardiology Office Note   Date:  06/27/2024  ID:  Ione, [place] [birth year], MRN [id number] PCP: Royden Ronal Czar, FNP  [facility] Providers Cardiologist:  Georganna Archer, MD     History of Present Illness Misty Newman is a 78 y.o. female with a past medical history of hypertension, RBBB, mitral valve prolapse, hypothyroidism, dyslipidemia, palpitations  11/04/2023 echo EF 60 to 65%, grade 1 DD, trivial MR 09/23/2018 echo EF 55 to 60%, grade 2 DD 02/12/2016 echo EF 65 to 70%, grade 1 DD, mild MR, no PFO 11/04/2022 echo EF 55 to 60%, grade 1 DD, mild MR 09/10/2001 MPI normal, low risk  Initially followed by Dr. Maye around 2003, underwent MPI at that time which was negative for ischemia, there is also some notation of left bundle branch block however, I do not see any EKGs revealing LBBB in her chart.  Established care with Dr. Burnard in 2017 for management of her hypertension and palpitations.  She was then lost to follow-up until around 2019, her palpitations appeared to be controlled, blood pressure elevated in the office however patient reports typically well-controlled at home.    Most recently she was evaluated by Dr. Burnard on 10/12/2023, repeat echocardiogram was arranged revealing a EF of 60 to 65%, grade 1 DD, trivial MR, it appears there was some discussion about her needing a knee replacement at that time as well and was ultimately advised she could follow-up in 1 year.  Most recent EKG 10/12/2023 revealed known RBBB.  She presents today for follow-up of pedal edema, she was contacted for preoperative telephonic visit for upcoming knee replacement and advised that she had some pedal edema so an in office visit was made.  She apparently has had intermittent issues with pedal edema for some time.  She is not necessarily bothered by it but she is aware of it.  Typically recedes in the morning and progresses as the day goes on.  She was considering knee replacement  however for now she wants to continue with injections.  She is bothered by claudication type pain at times.  She is independent, lives alone and takes care of all her own activities around her home.  She denies chest pain, palpitations, dyspnea, pnd, orthopnea, n, v, dizziness, syncope, weight gain, or early satiety.    ROS: Review of Systems  Cardiovascular:  Positive for leg swelling.  Musculoskeletal:  Positive for joint pain.  All other systems reviewed and are negative.    Studies Reviewed      Cardiac Studies & Procedures   ______________________________________________________________________________________________   STRESS TESTS  MYOCARDIAL PERFUSION IMAGING 09/10/2001   ECHOCARDIOGRAM  ECHOCARDIOGRAM COMPLETE 11/04/2023  Narrative ECHOCARDIOGRAM REPORT    Patient Name:   Misty Newman Date of Exam: 11/04/2023 Medical Rec #:  [id number]        Height:       67.0 in Accession #:    [id number]       Weight:       177.8 lb Date of Birth:  September 18, 1945       BSA:          1.924 m Patient Age:    77 years         BP:           138/88 mmHg Patient Gender: F                HR:           94  bpm. Exam Location:  [address]  Procedure: 2D Echo, Cardiac Doppler and Color Doppler (Both Spectral and Color Flow Doppler were utilized during procedure).  Indications:    Z01.810 Pre-op exam  History:        Patient has prior history of Echocardiogram examinations, most recent 09/23/2018. Pre-op exam, CAD, Arrythmias:RBBB, Signs/Symptoms:Palpitations; Risk Factors:Hypertension and Dyslipidemia.  Sonographer:    Elsie Bohr RDCS Referring Phys: [id number] THOMAS A KELLY  IMPRESSIONS   1. Left ventricular ejection fraction, by estimation, is 60 to 65%. The left ventricle has normal function. The left ventricle has no regional wall motion abnormalities. Left ventricular diastolic parameters are consistent with Grade I diastolic dysfunction (impaired relaxation). 2. Right  ventricular systolic function is normal. The right ventricular size is normal. There is normal pulmonary artery systolic pressure. The estimated right ventricular systolic pressure is 31.1 mmHg. 3. The mitral valve is normal in structure. Trivial mitral valve regurgitation. No evidence of mitral stenosis. 4. The aortic valve is tricuspid. Aortic valve regurgitation is not visualized. No aortic stenosis is present. 5. The inferior vena cava is normal in size with greater than 50% respiratory variability, suggesting right atrial pressure of 3 mmHg.  FINDINGS Left Ventricle: Left ventricular ejection fraction, by estimation, is 60 to 65%. The left ventricle has normal function. The left ventricle has no regional wall motion abnormalities. The left ventricular internal cavity size was normal in size. There is no left ventricular hypertrophy. Left ventricular diastolic parameters are consistent with Grade I diastolic dysfunction (impaired relaxation).  Right Ventricle: The right ventricular size is normal. No increase in right ventricular wall thickness. Right ventricular systolic function is normal. There is normal pulmonary artery systolic pressure. The tricuspid regurgitant velocity is 2.65 m/s, and with an assumed right atrial pressure of 3 mmHg, the estimated right ventricular systolic pressure is 31.1 mmHg.  Left Atrium: Left atrial size was normal in size.  Right Atrium: Right atrial size was normal in size.  Pericardium: There is no evidence of pericardial effusion.  Mitral Valve: The mitral valve is normal in structure. Trivial mitral valve regurgitation. No evidence of mitral valve stenosis.  Tricuspid Valve: The tricuspid valve is normal in structure. Tricuspid valve regurgitation is mild.  Aortic Valve: The aortic valve is tricuspid. Aortic valve regurgitation is not visualized. No aortic stenosis is present.  Pulmonic Valve: The pulmonic valve was normal in structure. Pulmonic valve  regurgitation is trivial.  Aorta: The aortic root is normal in size and structure.  Venous: The inferior vena cava is normal in size with greater than 50% respiratory variability, suggesting right atrial pressure of 3 mmHg.  IAS/Shunts: No atrial level shunt detected by color flow Doppler.   LEFT VENTRICLE PLAX 2D LVIDd:         4.40 cm   Diastology LVIDs:         2.70 cm   LV e' medial:    5.98 cm/s LV PW:         1.00 cm   LV E/e' medial:  22.1 LV IVS:        1.10 cm   LV e' lateral:   5.98 cm/s LVOT diam:     1.60 cm   LV E/e' lateral: 22.1 LV SV:         45 LV SV Index:   24 LVOT Area:     2.01 cm   RIGHT VENTRICLE             IVC RV S prime:  11.40 cm/s  IVC diam: 0.70 cm TAPSE (M-mode): 2.0 cm RVSP:           31.1 mmHg  LEFT ATRIUM             Index        RIGHT ATRIUM           Index LA diam:        3.70 cm 1.92 cm/m   RA Pressure: 3.00 mmHg LA Vol (A2C):   41.5 ml 21.57 ml/m  RA Area:     7.39 cm LA Vol (A4C):   34.3 ml 17.83 ml/m  RA Volume:   12.00 ml  6.24 ml/m LA Biplane Vol: 38.5 ml 20.01 ml/m AORTIC VALVE LVOT Vmax:   116.00 cm/s LVOT Vmean:  75.800 cm/s LVOT VTI:    0.226 m  AORTA Ao Root diam: 2.80 cm Ao Asc diam:  3.10 cm  MITRAL VALVE                TRICUSPID VALVE MV Area (PHT): 4.33 cm     TR Peak grad:   28.1 mmHg MV Decel Time: 175 msec     TR Vmax:        265.00 cm/s MV E velocity: 132.00 cm/s  Estimated RAP:  3.00 mmHg MV A velocity: 143.00 cm/s  RVSP:           31.1 mmHg MV E/A ratio:  0.92 SHUNTS Systemic VTI:  0.23 m Systemic Diam: 1.60 cm  Dalton McleanMD Electronically signed by Ezra Kanner Signature Date/Time: 11/04/2023/10:57:29 AM    Final          ______________________________________________________________________________________________      Risk Assessment/Calculations           Physical Exam VS:  BP 128/78   Pulse 75   Ht 5' 7 (1.702 m)   Wt 175 lb 9.6 oz (79.7 kg)   SpO2 96%   BMI 27.50  kg/m        Wt Readings from Last 3 Encounters:  06/27/24 175 lb 9.6 oz (79.7 kg)  05/19/24 174 lb 12.8 oz (79.3 kg)  10/12/23 177 lb 12.8 oz (80.6 kg)    GEN: Well nourished, well developed in no acute distress NECK: No JVD; No carotid bruits CARDIAC: RRR, no murmurs, rubs, gallops RESPIRATORY:  Clear to auscultation without rales, wheezing or rhonchi  ABDOMEN: Soft, non-tender, non-distended EXTREMITIES: Nonpitting edema present; No deformity   ASSESSMENT AND PLAN Pedal edema-this appears to be chronic, ongoing issue.  She does have edema but it is nonpitting, typically resolves overnight.  Echocardiogram earlier this year revealed a normal EF, grade 1 DD.  Will check a proBNP for completeness but I do think this is likely venous insufficiency.  Will check a c-Met.  Continue Lasix as needed.  Claudication-previous had ABIs in 2023 which were normal, she has been diagnosed with diabetes, will repeat ABI, likely this is more related to neuropathic type pain which she has been dealing with for some time.  RBBB-present on EKG dating back to 2017.    Hypertension-blood pressure is controlled at 128/78, continue ramipril  5 mg daily, Maxide 37.5-25 mg daily.  Dyslipidemia-currently on Crestor 20 mg daily, appears to be monitored by her PCP.  DM2-most recent A1c 6.8% and controlled.      Dispo: CMET, proBNP, ABIs, follow-up in 1 year.  Signed, Delon JAYSON Hoover, NP

## 2024-06-27 ENCOUNTER — Ambulatory Visit: Attending: Cardiology | Admitting: Cardiology

## 2024-06-27 ENCOUNTER — Encounter: Payer: Self-pay | Admitting: Cardiology

## 2024-06-27 VITALS — BP 128/78 | HR 75 | Ht 67.0 in | Wt 175.6 lb

## 2024-06-27 DIAGNOSIS — Z79899 Other long term (current) drug therapy: Secondary | ICD-10-CM | POA: Diagnosis not present

## 2024-06-27 DIAGNOSIS — Z01810 Encounter for preprocedural cardiovascular examination: Secondary | ICD-10-CM

## 2024-06-27 DIAGNOSIS — I4510 Unspecified right bundle-branch block: Secondary | ICD-10-CM | POA: Diagnosis not present

## 2024-06-27 DIAGNOSIS — I10 Essential (primary) hypertension: Secondary | ICD-10-CM | POA: Diagnosis not present

## 2024-06-27 DIAGNOSIS — R0609 Other forms of dyspnea: Secondary | ICD-10-CM | POA: Diagnosis not present

## 2024-06-27 DIAGNOSIS — E119 Type 2 diabetes mellitus without complications: Secondary | ICD-10-CM | POA: Diagnosis not present

## 2024-06-27 DIAGNOSIS — R600 Localized edema: Secondary | ICD-10-CM

## 2024-06-27 DIAGNOSIS — E782 Mixed hyperlipidemia: Secondary | ICD-10-CM

## 2024-06-27 DIAGNOSIS — L650 Telogen effluvium: Secondary | ICD-10-CM | POA: Diagnosis not present

## 2024-06-27 NOTE — Patient Instructions (Signed)
 Medication Instructions:  Your physician recommends that you continue on your current medications as directed. Please refer to the Current Medication list given to you today. *If you need a refill on your cardiac medications before your next appointment, please call your pharmacy*  Lab Work: TODAY-BNP & CMET If you have labs (blood work) drawn today and your tests are completely normal, you will receive your results only by: MyChart Message (if you have MyChart) OR A paper copy in the mail If you have any lab test that is abnormal or we need to change your treatment, we will call you to review the results.  Testing/Procedures: Your physician has requested that you have an ankle brachial index (ABI). During this test an ultrasound and blood pressure cuff are used to evaluate the arteries that supply the arms and legs with blood. Allow thirty minutes for this exam. There are no restrictions or special instructions.  Please note: We ask at that you not bring children with you during ultrasound (echo/ vascular) testing. Due to room size and safety concerns, children are not allowed in the ultrasound rooms during exams. Our front office staff cannot provide observation of children in our lobby area while testing is being conducted. An adult accompanying a patient to their appointment will only be allowed in the ultrasound room at the discretion of the ultrasound technician under special circumstances. We apologize for any inconvenience.  Follow-Up: At [facility], you and your health needs are our priority.  As part of our continuing mission to provide you with exceptional heart care, our providers are all part of one team.  This team includes your primary Cardiologist (physician) and Advanced Practice Providers or APPs (Physician Assistants and Nurse Practitioners) who all work together to provide you with the care you need, when you need it.  Your next appointment:   12  month(s)  Provider:   Georganna Archer, MD    We recommend signing up for the patient portal called MyChart.  Sign up information is provided on this After Visit Summary.  MyChart is used to connect with patients for Virtual Visits (Telemedicine).  Patients are able to view lab/test results, encounter notes, upcoming appointments, etc.  Non-urgent messages can be sent to your provider as well.   To learn more about what you can do with MyChart, go to [email or web address].   Other Instructions

## 2024-06-28 ENCOUNTER — Ambulatory Visit: Payer: Self-pay | Admitting: Cardiology

## 2024-06-28 LAB — COMPREHENSIVE METABOLIC PANEL WITH GFR
ALT: 15 IU/L (ref 0–32)
AST: 21 IU/L (ref 0–40)
Albumin: 4.7 g/dL (ref 3.8–4.8)
Alkaline Phosphatase: 103 IU/L (ref 49–135)
BUN/Creatinine Ratio: 15 (ref 12–28)
BUN: 17 mg/dL (ref 8–27)
Bilirubin Total: 0.4 mg/dL (ref 0.0–1.2)
CO2: 20 mmol/L (ref 20–29)
Calcium: 9.9 mg/dL (ref 8.7–10.3)
Chloride: 98 mmol/L (ref 96–106)
Creatinine, Ser: 1.13 mg/dL — ABNORMAL HIGH (ref 0.57–1.00)
Globulin, Total: 2.2 g/dL (ref 1.5–4.5)
Glucose: 124 mg/dL — ABNORMAL HIGH (ref 70–99)
Potassium: 4.3 mmol/L (ref 3.5–5.2)
Sodium: 138 mmol/L (ref 134–144)
Total Protein: 6.9 g/dL (ref 6.0–8.5)
eGFR: 51 mL/min/1.73 — ABNORMAL LOW

## 2024-06-28 LAB — PRO B NATRIURETIC PEPTIDE: NT-Pro BNP: 231 pg/mL (ref 0–738)

## 2024-07-04 ENCOUNTER — Encounter: Payer: Self-pay | Admitting: Radiology

## 2024-07-04 ENCOUNTER — Encounter: Payer: Self-pay | Admitting: Physician Assistant

## 2024-07-04 ENCOUNTER — Ambulatory Visit: Admitting: Physician Assistant

## 2024-07-04 DIAGNOSIS — M1711 Unilateral primary osteoarthritis, right knee: Secondary | ICD-10-CM | POA: Diagnosis not present

## 2024-07-04 MED ORDER — METHYLPREDNISOLONE ACETATE 40 MG/ML IJ SUSP
40.0000 mg | INTRAMUSCULAR | Status: AC | PRN
  Administered 2024-07-04: 40 mg via INTRA_ARTICULAR

## 2024-07-04 MED ORDER — LIDOCAINE HCL 1 % IJ SOLN
3.0000 mL | INTRAMUSCULAR | Status: AC | PRN
  Administered 2024-07-04: 3 mL

## 2024-07-04 NOTE — Progress Notes (Signed)
 HPI: Misty Newman comes in today for her right knee pain.  She was last seen for the right knee on 04/11/2024 and the knee was injected and aspirated.  She states she is having increased pain in the.  She has been taking Tylenol.  She notes that she feels she has fluid on the knee.  Denies any ongoing fevers chills or infection.  Review of systems see HPI otherwise negative  Physical exam: General Well-developed well-nourished female in no acute distress. Bilateral knees: Good range of motion of both knees.  Right knee slight effusion.  No instability valgus varus stressing of either knee.  No abnormal warmth or erythema of either knee.  Edema bilateral lower extremities +1 pitting right lower leg.  Calves are supple and nontender.  Impression: Right knee arthritis  Plan: Per her request we aspirated and injected the right knee today.  Recommend that she use compression hose for edema in both legs. Follow-up as needed.    Procedure Note  [name]             Date of Birth: 08/03/1946           MRN: [id number]             Visit Date: 07/04/2024  Procedures: Visit Diagnoses: No diagnosis found.  Large Joint Inj: R knee on 07/04/2024 5:56 PM Indications: pain Details: 22 G 1.5 in needle, superolateral approach  Arthrogram: No  Medications: 3 mL lidocaine  1 %; 40 mg methylPREDNISolone  acetate 40 MG/ML Aspirate: 6 mL yellow Outcome: tolerated well, no immediate complications Procedure, treatment alternatives, risks and benefits explained, specific risks discussed. Consent was given by the patient. Immediately prior to procedure a time out was called to verify the correct patient, procedure, equipment, support staff and site/side marked as required. Patient was prepped and draped in the usual sterile fashion.

## 2024-07-13 ENCOUNTER — Ambulatory Visit (HOSPITAL_COMMUNITY)
Admission: RE | Admit: 2024-07-13 | Discharge: 2024-07-13 | Disposition: A | Discharge status: Home / Self Care | Source: Ambulatory Visit | Attending: Cardiology | Admitting: Cardiology

## 2024-07-13 DIAGNOSIS — I4510 Unspecified right bundle-branch block: Secondary | ICD-10-CM | POA: Diagnosis present

## 2024-07-13 DIAGNOSIS — R600 Localized edema: Secondary | ICD-10-CM | POA: Insufficient documentation

## 2024-07-13 LAB — VAS US ABI WITH/WO TBI
Left ABI: 1.23
Right ABI: 1.26

## 2024-09-27 ENCOUNTER — Telehealth: Payer: Self-pay

## 2024-09-27 NOTE — Telephone Encounter (Signed)
 Patient would like to proceed with surgery.  Please advise.  Thank you.

## 2024-09-28 NOTE — Telephone Encounter (Signed)
 Talked with patient and advised her of message below per Tory.  Patient voiced that she understands and stated that she has an appt.with the cardiologist on 10/04/24.

## 2024-10-04 ENCOUNTER — Ambulatory Visit

## 2024-10-04 VITALS — BP 126/54 | HR 90 | Ht 67.0 in | Wt 174.0 lb

## 2024-10-04 DIAGNOSIS — R600 Localized edema: Secondary | ICD-10-CM

## 2024-10-04 DIAGNOSIS — I10 Essential (primary) hypertension: Secondary | ICD-10-CM

## 2024-10-04 DIAGNOSIS — R002 Palpitations: Secondary | ICD-10-CM | POA: Diagnosis not present

## 2024-10-04 NOTE — Patient Instructions (Signed)
 Medication Instructions:  None  *If you need a refill on your cardiac medications before your next appointment, please call your pharmacy*  Lab Work: None  If you have labs (blood work) drawn today and your tests are completely normal, you will receive your results only by: MyChart Message (if you have MyChart) OR A paper copy in the mail If you have any lab test that is abnormal or we need to change your treatment, we will call you to review the results.  Testing/Procedures: None   Follow-Up: At [facility], you and your health needs are our priority.  As part of our continuing mission to provide you with exceptional heart care, our providers are all part of one team.  This team includes your primary Cardiologist (physician) and Advanced Practice Providers or APPs (Physician Assistants and Nurse Practitioners) who all work together to provide you with the care you need, when you need it.  Your next appointment:   1 year(s)  Provider:   Dr. Joelle Cedars    We recommend signing up for the patient portal called MyChart.  Sign up information is provided on this After Visit Summary.  MyChart is used to connect with patients for Virtual Visits (Telemedicine).  Patients are able to view lab/test results, encounter notes, upcoming appointments, etc.  Non-urgent messages can be sent to your provider as well.   To learn more about what you can do with MyChart, go to [email or web address].   Other Instructions None
